# Patient Record
Sex: Female | Born: 1955 | Hispanic: No | Marital: Married | Smoking: Never smoker
Health system: Southern US, Community
[De-identification: ages and names within clinical notes are randomized; demographics above are authoritative.]

## PROBLEM LIST (undated history)

## (undated) DIAGNOSIS — I10 Essential (primary) hypertension: Secondary | ICD-10-CM

## (undated) DIAGNOSIS — E7800 Pure hypercholesterolemia, unspecified: Secondary | ICD-10-CM

---

## 2011-09-04 ENCOUNTER — Encounter (HOSPITAL_COMMUNITY): Payer: Self-pay

## 2011-09-04 ENCOUNTER — Emergency Department (INDEPENDENT_AMBULATORY_CARE_PROVIDER_SITE_OTHER)
Admission: EM | Admit: 2011-09-04 | Discharge: 2011-09-04 | Disposition: A | Discharge status: Home / Self Care | Payer: Medicaid Other | Source: Home / Self Care | Attending: Emergency Medicine | Admitting: Emergency Medicine

## 2011-09-04 DIAGNOSIS — Z760 Encounter for issue of repeat prescription: Secondary | ICD-10-CM

## 2011-09-04 DIAGNOSIS — E119 Type 2 diabetes mellitus without complications: Secondary | ICD-10-CM

## 2011-09-04 HISTORY — DX: Essential (primary) hypertension: I10

## 2011-09-04 HISTORY — DX: Pure hypercholesterolemia, unspecified: E78.00

## 2011-09-04 LAB — POCT URINALYSIS DIP (DEVICE)
Bilirubin Urine: NEGATIVE
Glucose, UA: NEGATIVE mg/dL
Hgb urine dipstick: NEGATIVE
Ketones, ur: NEGATIVE mg/dL
Specific Gravity, Urine: 1.02 (ref 1.005–1.030)
pH: 5.5 (ref 5.0–8.0)

## 2011-09-04 LAB — POCT I-STAT, CHEM 8
BUN: 11 mg/dL (ref 6–23)
Calcium, Ion: 1.23 mmol/L (ref 1.12–1.32)
Chloride: 101 mEq/L (ref 96–112)
Glucose, Bld: 155 mg/dL — ABNORMAL HIGH (ref 70–99)
Potassium: 3.6 mEq/L (ref 3.5–5.1)

## 2011-09-04 MED ORDER — CANDESARTAN CILEXETIL-HCTZ 16-12.5 MG PO TABS: 1.0000 | ORAL_TABLET | Freq: Every day | ORAL | Status: DC

## 2011-09-04 MED ORDER — METFORMIN HCL 850 MG PO TABS: 850.0000 mg | ORAL_TABLET | Freq: Two times a day (BID) | ORAL | Status: DC

## 2011-09-04 MED ORDER — GLIMEPIRIDE 2 MG PO TABS: 2.0000 mg | ORAL_TABLET | Freq: Every day | ORAL | Status: DC

## 2011-09-04 MED ORDER — FREESTYLE SYSTEM KIT: 1.0000 | PACK | Freq: Every day | Status: AC

## 2011-09-04 MED ORDER — GLUCOSE BLOOD VI STRP: ORAL_STRIP | Status: DC

## 2011-09-04 NOTE — ED Provider Notes (Signed)
History     CSN: [id number]  Arrival date & time 09/04/11  1350   First MD Initiated Contact with Patient 09/04/11 1425      Chief Complaint  Patient presents with  . Hypertension    (Consider location/radiation/quality/duration/timing/severity/associated sxs/prior treatment) HPI Comments: All history contained from patient and son, who interpreted for the patient. Patient is here for medication refill. Patient is here from [place], and is about to run out of her diabetic and blood pressure medications. Patient brought in empty boxes of metformin, glimepiride, and candesartan/HCTZ. Patient also brought in another empty packet of an oral hypoglycemic, but states that she's been out of this for over 2 months. Patient reports some fatigue, and urinary urgency, frequency, especially at night. She does not have a glucometer at home, and does not check her sugars. Patient denies any chest pain, coughing, wheezing, shortness of breath, nausea, vomiting, abdominal pain, fevers. They have an appointment with a primary care physician on May 1, but the son is unable to remember  the physician's name or practice.  ROS as noted in HPI. All other ROS negative.   The history is provided by the patient and a relative. A language interpreter was used.    Past Medical History  Diagnosis Date  . Hypertension   . Diabetes mellitus   . High cholesterol     History reviewed. No pertinent past surgical history.  History reviewed. No pertinent family history.  History  Substance Use Topics  . Smoking status: Never Smoker   . Smokeless tobacco: Not on file  . Alcohol Use: No    OB History    Grav Para Term Preterm Abortions TAB SAB Ect Mult Living                  Review of Systems  Allergies  Penicillins  Home Medications   Current Outpatient Rx  Name Route Sig Dispense Refill  . CANDESARTAN CILEXETIL-HCTZ 16-12.5 MG PO TABS Oral Take 1 tablet by mouth daily. 30 tablet 0  . GLIMEPIRIDE  2 MG PO TABS Oral Take 1 tablet (2 mg total) by mouth daily before breakfast. 30 tablet 0  . GLUCOSE BLOOD VI STRP  Check your sugar in the morning before you eat breakfast, and one hour after a meal. 100 each 12  . METFORMIN HCL 850 MG PO TABS Oral Take 1 tablet (850 mg total) by mouth 2 (two) times daily with a meal. 60 tablet 0    BP 137/90  Pulse 82  Temp(Src) 98.5 F (36.9 C) (Oral)  Resp 18  SpO2 98%  Physical Exam  Nursing note and vitals reviewed. Constitutional: She is oriented to person, place, and time. She appears well-developed and well-nourished.  HENT:  Head: Normocephalic and atraumatic.  Eyes: Conjunctivae and EOM are normal.  Neck: Normal range of motion.  Cardiovascular: Normal rate, regular rhythm and normal heart sounds.   Pulmonary/Chest: Effort normal and breath sounds normal.  Abdominal: Soft. Bowel sounds are normal. She exhibits no distension. There is no tenderness.  Musculoskeletal: Normal range of motion. She exhibits no edema and no tenderness.  Neurological: She is alert and oriented to person, place, and time.  Skin: Skin is warm and dry.  Psychiatric: She has a normal mood and affect. Her behavior is normal. Judgment and thought content normal.    ED Course  Procedures (including critical care time)  Labs Reviewed  GLUCOSE, CAPILLARY - Abnormal; Notable for the following:    Glucose-Capillary 151 (*)  All other components within normal limits  POCT I-STAT, CHEM 8 - Abnormal; Notable for the following:    Glucose, Bld 155 (*)    All other components within normal limits  POCT URINALYSIS DIP (DEVICE)   No results found.   1. Medication refill   2. Diabetes mellitus      Results for orders placed during the hospital encounter of 09/04/11  GLUCOSE, CAPILLARY      Component Value Range   Glucose-Capillary 151 (*) 70 - 99 (mg/dL)  POCT URINALYSIS DIP (DEVICE)      Component Value Range   Glucose, UA NEGATIVE  NEGATIVE (mg/dL)    Bilirubin Urine NEGATIVE  NEGATIVE    Ketones, ur NEGATIVE  NEGATIVE (mg/dL)   Specific Gravity, Urine 1.020  1.005 - 1.030    Hgb urine dipstick NEGATIVE  NEGATIVE    pH 5.5  5.0 - 8.0    Protein, ur NEGATIVE  NEGATIVE (mg/dL)   Urobilinogen, UA 0.2  0.0 - 1.0 (mg/dL)   Nitrite NEGATIVE  NEGATIVE    Leukocytes, UA NEGATIVE  NEGATIVE   POCT I-STAT, CHEM 8      Component Value Range   Sodium 139  135 - 145 (mEq/L)   Potassium 3.6  3.5 - 5.1 (mEq/L)   Chloride 101  96 - 112 (mEq/L)   BUN 11  6 - 23 (mg/dL)   Creatinine, Ser 1.61  0.50 - 1.10 (mg/dL)   Glucose, Bld 096 (*) 70 - 99 (mg/dL)   Calcium, Ion 0.45  4.09 - 1.32 (mmol/L)   TCO2 30  0 - 100 (mmol/L)   Hemoglobin 14.3  12.0 - 15.0 (g/dL)   HCT 81.1  91.4 - 78.2 (%)    MDM  Checking UA, as patient reports some urinary urgency and frequency. Patient has no abdominal pain, abdomen benign here. Also checking i-STAT, this patient has no previous labs or records available, prior to restarting patient on her medications. Patient is not spilling any glucose in urine, and i-STAT acceptable here today. Will not restart her on the other oral diabetic medication, as patient seems to be doing well on the current combination. Encouraged her to keep a log of her blood sugars so that her primary care physician can make the appropriate adjustments as needed. Discussed with son the importance of following up with the patient's primary care physician for ongoing management of her diabetes and hypertension. They agree with plan.  Luiz Blare, MD 09/04/11 [id number]

## 2011-09-04 NOTE — ED Notes (Addendum)
Traveling; out of her medications; feels tired, feels as if her glucose is low; NAD

## 2011-09-04 NOTE — Discharge Instructions (Signed)
Followup with your Dr. as scheduled. Be sure to check your sugar before breakfast, and then an hour after a meal. Keep a log of your blood sugars, this will help your primary care physician decide if you need to change your medication. Return if you have a fever above 100.4, if you get worse, or for any other concerns.

## 2011-09-06 MED ORDER — LOSARTAN POTASSIUM-HCTZ 50-12.5 MG PO TABS: 1.0000 | ORAL_TABLET | Freq: Every day | ORAL | Status: DC

## 2011-09-06 NOTE — ED Provider Notes (Signed)
E-prescribed a new rx (losartan-HCTZ 50/12.5 mg) to [facility] on [address].  Renaee Munda, MD 09/06/11 2134

## 2011-09-09 ENCOUNTER — Telehealth (HOSPITAL_COMMUNITY): Payer: Self-pay | Admitting: *Deleted

## 2011-09-09 NOTE — ED Notes (Signed)
I called pharmacist @ [facility] and told her we do not do prior auhorization's because we are not a primary care facility.  I told her Dr. Juanetta Gosling had e-prescribed the Hyzaar on Fri.  The pharmacist said it is only $57.99 and the [place] HCTZ  was $193.99. She said she will call the pt. and see if they can afford this.  She said the Lisinopril HCTZ is $22.29.  I asked Dr. Juanetta Gosling and he does not want to go with that medication. See if pt. can afford the Hyzaar.   Jacqueline Bush 09/09/2011

## 2011-10-04 ENCOUNTER — Telehealth (HOSPITAL_COMMUNITY): Payer: Self-pay | Admitting: *Deleted

## 2011-10-04 NOTE — ED Notes (Signed)
[facility] faxed prior authorization form for Losartan/HCTZ 50/12.5 mg. From Dr. Juanetta Gosling. He said to try and split them.  I called the pharmacist and she said Medicaid will cover the HCTZ but not the Losartan. She said they won't cover any ARB's.  They will cover Lisinopril.  Discussed with Dr. Juanetta Gosling and he said he did not see the pt. and does not like to change classes of drugs without seeing the pt. first. He said patient was supposed to f/u with PCP on 5/1. Call pt. and see if she did. If not Dr. Chaney Malling said it was OK to change it to Lisinopril 10 mg. QD #30 until pt. sees her PCP.

## 2011-10-04 NOTE — ED Notes (Signed)
I called and talked to patients daughter. She said she has not been seen by the PCP yet. She has to finish some paperwork before they will schedule her appt.  I told her the doctor was going to change the medicine to Lisinopril/HCTZ 10/12.5 mg. 1 QD #30 and to f/u with PCP ASAP.  I asked if she wants it at [facility] or [facility]. It is on the $ 4.00 list at [facility]. She said [facility] on [address]. I told her they don't have pharmacy. She said [facility] on [address]. Rx. called to pharmacist @ [phone]. Vassie Moselle 10/04/2011

## 2011-10-10 ENCOUNTER — Encounter: Payer: Self-pay | Admitting: Family Medicine

## 2011-10-10 ENCOUNTER — Ambulatory Visit (INDEPENDENT_AMBULATORY_CARE_PROVIDER_SITE_OTHER): Payer: Medicaid Other | Admitting: Family Medicine

## 2011-10-10 VITALS — BP 120/81 | HR 72 | Ht 63.0 in | Wt 224.0 lb

## 2011-10-10 DIAGNOSIS — I10 Essential (primary) hypertension: Secondary | ICD-10-CM

## 2011-10-10 DIAGNOSIS — R413 Other amnesia: Secondary | ICD-10-CM

## 2011-10-10 DIAGNOSIS — R7309 Other abnormal glucose: Secondary | ICD-10-CM

## 2011-10-10 DIAGNOSIS — E119 Type 2 diabetes mellitus without complications: Secondary | ICD-10-CM

## 2011-10-10 DIAGNOSIS — R739 Hyperglycemia, unspecified: Secondary | ICD-10-CM

## 2011-10-10 MED ORDER — METFORMIN HCL 1000 MG PO TABS: 1000.0000 mg | ORAL_TABLET | Freq: Two times a day (BID) | ORAL | Status: DC

## 2011-10-10 MED ORDER — LOSARTAN POTASSIUM 50 MG PO TABS: 50.0000 mg | ORAL_TABLET | Freq: Every day | ORAL | Status: DC

## 2011-10-10 MED ORDER — WRIST BLOOD PRESSURE MONITOR MISC: 1.0000 | Freq: Once | Status: AC

## 2011-10-10 MED ORDER — GLUCOSE BLOOD VI STRP: ORAL_STRIP | Status: AC

## 2011-10-10 MED ORDER — ACCU-CHEK SOFT TOUCH LANCETS MISC: Status: AC

## 2011-10-10 MED ORDER — HYDROCHLOROTHIAZIDE 12.5 MG PO CAPS: 12.5000 mg | ORAL_CAPSULE | Freq: Every day | ORAL | Status: DC

## 2011-10-10 MED ORDER — GLIMEPIRIDE 2 MG PO TABS: 4.0000 mg | ORAL_TABLET | Freq: Every day | ORAL | Status: DC

## 2011-10-10 NOTE — Progress Notes (Signed)
  Subjective:    Patient ID: Jacqueline Bush, female    DOB: September 24, 1955, 56 y.o.   MRN: [id number]  HPI 1) Diabetes: Out of medications, has been seen in urgent care but no pcp- has gone back and forth to [place] for medical care.   Has been out of diabetes medications- usually takes amaryl 4mg  daily, januvia type medication, and metformin bid.  Not checking bg currently b/c out of lancets and strips.  Would like rx refill for these.  Also would like all refills for 3 month supply b/c may be traveling back to [place] and wants to have ample supply.  No symptoms of hypoglycemia.  No shakiness.  No confusion.  No irritability.    2) HTN: On bp combination pill that has ARB and HCTZ medications in it.  Got this from [place].  Would like to have refill on this medication.  Aware that medicaid may not pay for combination pills so is ok with taking 2 tablets in place of this one mediation.   Pt has been taking bp medication daily.  Not yet out of these tablets.  BP today 120/81.  No dizziness.  No vision changes. No syncope.    3) Memory problems: Seems to be more forgetful recently, will forget things that she is told.  No specific examples that they can remember today.  Pt and family are concerned about her worsening memory.  Would like to have this fully evaluated.  Functioning well, doing ADLs without problem.    HPI obtained with the help of arabic interpreter that pt brought with her to her appointment today.   Review of Systems As per above    Objective:   Physical Exam  Constitutional: She appears well-developed and well-nourished. No distress.  HENT:  Head: Normocephalic and atraumatic.  Mouth/Throat: No oropharyngeal exudate.  Eyes: Conjunctivae are normal. Right eye exhibits no discharge. Left eye exhibits no discharge.  Neck: Normal range of motion.  Cardiovascular: Normal rate, regular rhythm and normal heart sounds.   No murmur heard. Pulmonary/Chest: Effort normal and breath sounds  normal. No respiratory distress. She has no wheezes.  Abdominal: Soft. She exhibits no distension.  Neurological: She is alert.  Psychiatric: She has a normal mood and affect.          Assessment & Plan:

## 2011-10-10 NOTE — Patient Instructions (Signed)
Return in 2-4 weeks for recheck and to discuss memory problem

## 2011-10-13 ENCOUNTER — Encounter: Payer: Self-pay | Admitting: Family Medicine

## 2011-10-13 DIAGNOSIS — I10 Essential (primary) hypertension: Secondary | ICD-10-CM | POA: Insufficient documentation

## 2011-10-13 DIAGNOSIS — E785 Hyperlipidemia, unspecified: Secondary | ICD-10-CM | POA: Insufficient documentation

## 2011-10-13 DIAGNOSIS — R413 Other amnesia: Secondary | ICD-10-CM | POA: Insufficient documentation

## 2011-10-13 DIAGNOSIS — E119 Type 2 diabetes mellitus without complications: Secondary | ICD-10-CM | POA: Insufficient documentation

## 2011-10-13 NOTE — Assessment & Plan Note (Addendum)
Pt to return in 2-4 weeks for f/up on this problem.  Pt and family to keep detailed logbook of examples of memory loss and how frequent this is occuring

## 2011-10-13 NOTE — Assessment & Plan Note (Addendum)
Will give rx for losartan and hctz which is very similar to the combination pill she was on in [place].  Baseline creatinine obtained in April at urgent care- creatinine 0.8. Pt to return in 2-4 weeks for fup.

## 2011-10-13 NOTE — Assessment & Plan Note (Addendum)
Will give refills on medications that she was previously prescribed by MD in [place]- amaryl 4 mg and metformin 1000mg  po bid.  Will hold on januvia at this time and see how pt does with taking these 2 medications consistently.  If needed can restart at 3 month f/up appointment.  Also at future appt need to do foot check, see if dental and eye exams uptodate, do more nutrition teaching, and ensure that pt is checking blood glucose levels at home.

## 2011-10-24 ENCOUNTER — Encounter: Payer: Self-pay | Admitting: Family Medicine

## 2011-10-24 ENCOUNTER — Ambulatory Visit (INDEPENDENT_AMBULATORY_CARE_PROVIDER_SITE_OTHER): Payer: Medicaid Other | Admitting: Family Medicine

## 2011-10-24 VITALS — BP 113/76 | HR 76 | Ht 63.0 in | Wt 222.0 lb

## 2011-10-24 DIAGNOSIS — E119 Type 2 diabetes mellitus without complications: Secondary | ICD-10-CM

## 2011-10-24 DIAGNOSIS — R413 Other amnesia: Secondary | ICD-10-CM

## 2011-10-24 DIAGNOSIS — I10 Essential (primary) hypertension: Secondary | ICD-10-CM

## 2011-10-24 LAB — COMPREHENSIVE METABOLIC PANEL
ALT: 16 U/L (ref 0–35)
Albumin: 4.4 g/dL (ref 3.5–5.2)
CO2: 28 mEq/L (ref 19–32)
Calcium: 9.9 mg/dL (ref 8.4–10.5)
Chloride: 99 mEq/L (ref 96–112)
Glucose, Bld: 167 mg/dL — ABNORMAL HIGH (ref 70–99)
Sodium: 138 mEq/L (ref 135–145)
Total Bilirubin: 0.5 mg/dL (ref 0.3–1.2)
Total Protein: 7.4 g/dL (ref 6.0–8.3)

## 2011-10-24 LAB — CBC WITH DIFFERENTIAL/PLATELET
Eosinophils Absolute: 0.1 10*3/uL (ref 0.0–0.7)
Hemoglobin: 12.2 g/dL (ref 12.0–15.0)
Lymphocytes Relative: 34 % (ref 12–46)
Lymphs Abs: 2 10*3/uL (ref 0.7–4.0)
MCH: 26.8 pg (ref 26.0–34.0)
MCV: 79.8 fL (ref 78.0–100.0)
Monocytes Relative: 7 % (ref 3–12)
Neutrophils Relative %: 56 % (ref 43–77)
Platelets: 209 10*3/uL (ref 150–400)
RBC: 4.56 MIL/uL (ref 3.87–5.11)
WBC: 5.9 10*3/uL (ref 4.0–10.5)

## 2011-10-27 MED ORDER — LISINOPRIL-HYDROCHLOROTHIAZIDE 10-12.5 MG PO TABS: 1.0000 | ORAL_TABLET | Freq: Every day | ORAL | Status: DC

## 2011-10-27 MED ORDER — SERTRALINE HCL 50 MG PO TABS: 50.0000 mg | ORAL_TABLET | Freq: Every day | ORAL | Status: DC

## 2011-10-27 NOTE — Assessment & Plan Note (Signed)
Pt to continue to monitor bG levels and bring in bg log to next apppointment.  Will discuss with patient at next appt about possibly restarting [place] if she would like- since she continues to have elevations and seemed to doing well on januvia type medication back in [place].  Will discuss in more detail at future appointment.

## 2011-10-27 NOTE — Assessment & Plan Note (Signed)
bp well controlled on current regimen.  Will change rx to combo medication as pt requested.

## 2011-10-27 NOTE — Progress Notes (Addendum)
Subjective:    Patient ID: Jacqueline Bush, female    DOB: Jun 11, 1955, 56 y.o.   MRN: [id number]  HPI Memory problems: Pt states that she is very forgetful- forgets that stove is on and that something is on the stove.  Called interpreter 4 x day prior to appointment to make sure that she was going to come and pick her up and interpret for her.  She asks friends the same question multiple x in the same day. Can't remember to take her medication.  Forgets where she puts things.  This started approx 3 years ago- but seemed to get worse after her son had a serious car accident that left him with a hip replacement, abnormal gait, and apparently he has a severe untreated depression.   He lives with him and everytime that she looks at him she just crys.  Even during the appointment when she talks about him- she becomes tearful.  PHQ-9 performed- on a daily basis- pt has feelings of depression,  Feels hopeless, has little interest in doing things (used to enjoy baking),  Has sleep problems,  Has very little appetite, has problems with concentration, moves slower than others around her.  Denies any SI or HI.  PHQ-9 socre of 24. No fever. No drowsiness. No seizures.   Diabetes: States that she has been taking all blood sugar medications.  Fasting bg has been running in 150's.  And pp BG 170-180's.  No shakiness. No dizziness. No syncope.  No fever.  No symptoms of hypoglycemia.  No abd pain. Tolerating all medications well.   HTN: Taking bp medications as directed.  Pt states that she would like to go back on to combination pill of - prinizidez/zesthetic.  Did ok with these meds separate but checked with pharmacy and medicaid will pay for the combination pill as well and she prefers the 1 combo pill in comparison to the 2 tabs. No h/a. No vision changes. No syncope.  Not checking bp at hom.   Smoking status reviewed.   Review of Systems As per above    Objective:   Physical Exam  Constitutional: She is  oriented to person, place, and time. She appears well-developed and well-nourished.  HENT:  Head: Normocephalic and atraumatic.  Eyes: Conjunctivae and EOM are normal. Pupils are equal, round, and reactive to light. Right eye exhibits no discharge. Left eye exhibits no discharge.  Neck: Normal range of motion. Neck supple. No thyromegaly present.  Cardiovascular: Normal rate, regular rhythm and normal heart sounds.   No murmur heard. Pulmonary/Chest: Effort normal and breath sounds normal. No respiratory distress. She has no wheezes. She has no rales. She exhibits no tenderness.  Musculoskeletal: She exhibits no edema.  Lymphadenopathy:    She has no cervical adenopathy.  Neurological: She is alert and oriented to person, place, and time. She displays normal reflexes. No cranial nerve deficit. She exhibits normal muscle tone. Coordination normal.       CN II-XII grossly intact   Clock drawing test- within normal limits.   Skin: No rash noted.  Psychiatric:       Flat affect.  PHQ-9 score as per HPI.    Had some difficulty repeating back the 3 items- repeated back 2 of the 3 items correction.   Only got 1 item correct for 3 item recall.    Able to identify 3 items and name correctly.              Assessment & Plan:

## 2011-10-27 NOTE — Assessment & Plan Note (Addendum)
Pt agrees to cbc, bmet,  tsh lab draw today.  Wanted to hold on CT scan at this time and try the medication that I was recommending.  I recommend that pt start on zoloft 25mg  x 1 week, then increase to 50mg  daily.  We must first treat pt's depression and see if this helps resolve symtpoms.  I think with significant impairment that CT scan is still reasonable but pt refuses at this time- will discuss at future appointment.  Pt to return in 2 weeks for recheck.   Also, told pt that if her son doesn't have pcp that I could talk to administration of our clinic to see if we could  Get him in at our clinic for evaluation and treatment for his depressed mood.   Greater than 50% of face time spent in counseling patient.

## 2013-01-13 ENCOUNTER — Telehealth: Payer: Self-pay | Admitting: *Deleted

## 2013-01-13 NOTE — Telephone Encounter (Signed)
Letter sent regarding diabetes follow up care Elizabeth Daniah Zaldivar, RN-BSN   

## 2013-02-15 ENCOUNTER — Emergency Department (HOSPITAL_COMMUNITY): Payer: Medicaid Other

## 2013-02-15 ENCOUNTER — Emergency Department (HOSPITAL_COMMUNITY)
Admission: EM | Admit: 2013-02-15 | Discharge: 2013-02-16 | Disposition: A | Discharge status: Home / Self Care | Payer: Medicaid Other | Attending: Emergency Medicine | Admitting: Emergency Medicine

## 2013-02-15 ENCOUNTER — Encounter (HOSPITAL_COMMUNITY): Payer: Self-pay | Admitting: *Deleted

## 2013-02-15 DIAGNOSIS — R0602 Shortness of breath: Secondary | ICD-10-CM | POA: Insufficient documentation

## 2013-02-15 DIAGNOSIS — I10 Essential (primary) hypertension: Secondary | ICD-10-CM | POA: Insufficient documentation

## 2013-02-15 DIAGNOSIS — E119 Type 2 diabetes mellitus without complications: Secondary | ICD-10-CM | POA: Insufficient documentation

## 2013-02-15 DIAGNOSIS — Z880 Allergy status to penicillin: Secondary | ICD-10-CM | POA: Insufficient documentation

## 2013-02-15 DIAGNOSIS — Z79899 Other long term (current) drug therapy: Secondary | ICD-10-CM | POA: Insufficient documentation

## 2013-02-15 DIAGNOSIS — T5491XA Toxic effect of unspecified corrosive substance, accidental (unintentional), initial encounter: Secondary | ICD-10-CM | POA: Insufficient documentation

## 2013-02-15 DIAGNOSIS — R1310 Dysphagia, unspecified: Secondary | ICD-10-CM | POA: Insufficient documentation

## 2013-02-15 DIAGNOSIS — T5991XA Toxic effect of unspecified gases, fumes and vapors, accidental (unintentional), initial encounter: Secondary | ICD-10-CM

## 2013-02-15 DIAGNOSIS — Y9289 Other specified places as the place of occurrence of the external cause: Secondary | ICD-10-CM | POA: Insufficient documentation

## 2013-02-15 DIAGNOSIS — Z862 Personal history of diseases of the blood and blood-forming organs and certain disorders involving the immune mechanism: Secondary | ICD-10-CM | POA: Insufficient documentation

## 2013-02-15 DIAGNOSIS — Z8639 Personal history of other endocrine, nutritional and metabolic disease: Secondary | ICD-10-CM | POA: Insufficient documentation

## 2013-02-15 DIAGNOSIS — Y9389 Activity, other specified: Secondary | ICD-10-CM | POA: Insufficient documentation

## 2013-02-15 MED ORDER — ALBUTEROL SULFATE HFA 108 (90 BASE) MCG/ACT IN AERS
2.0000 | INHALATION_SPRAY | RESPIRATORY_TRACT | Status: DC | PRN
  Administered 2013-02-16: 2 via RESPIRATORY_TRACT
  Filled 2013-02-15: qty 6.7

## 2013-02-15 MED ORDER — ALBUTEROL SULFATE (5 MG/ML) 0.5% IN NEBU
2.5000 mg | INHALATION_SOLUTION | Freq: Once | RESPIRATORY_TRACT | Status: AC
  Administered 2013-02-15: 2.5 mg via RESPIRATORY_TRACT
  Filled 2013-02-15: qty 0.5

## 2013-02-15 NOTE — ED Provider Notes (Signed)
CSN: [id number]     Arrival date & time 02/15/13  2104 History   First MD Initiated Contact with Patient 02/15/13 2118     Chief Complaint  Patient presents with  . Toxic Inhalation   (Consider location/radiation/quality/duration/timing/severity/associated sxs/prior Treatment) HPI Comments: Patient presents to the ER for evaluation after inhalation of chemicals. Patient was clean her bathroom with Clorox bleach and a toilet cleaner when she became aware of fumes that were causing her to feel short of breath. She feels like she is having trouble swallowing her throat is painful and swollen. There is no associated chest pain.   Past Medical History  Diagnosis Date  . Hypertension   . Diabetes mellitus   . High cholesterol    History reviewed. No pertinent past surgical history. Family History  Problem Relation Age of Onset  . Diabetes Mother   . Hypertension Mother   . Hypertension Father   . Heart disease Father   . Hyperlipidemia Father    History  Substance Use Topics  . Smoking status: Never Smoker   . Smokeless tobacco: Not on file  . Alcohol Use: No   OB History   Grav Para Term Preterm Abortions TAB SAB Ect Mult Living                 Review of Systems  HENT: Positive for trouble swallowing.   Respiratory: Positive for shortness of breath.   All other systems reviewed and are negative.    Allergies  Penicillins  Home Medications   Current Outpatient Rx  Name  Route  Sig  Dispense  Refill  . metFORMIN (GLUCOPHAGE) 850 MG tablet   Oral   Take 850 mg by mouth 2 (two) times daily with a meal.         . Blood Pressure Monitoring (WRIST BLOOD PRESSURE MONITOR) MISC   Does not apply   1 Device by Does not apply route once.   1 each   0   . glimepiride (AMARYL) 2 MG tablet   Oral   Take 2 tablets (4 mg total) by mouth daily before breakfast.   90 tablet   4   . lisinopril-hydrochlorothiazide (ZESTORETIC) 10-12.5 MG per tablet   Oral   Take 1 tablet  by mouth daily.   90 tablet   3   . EXPIRED: sertraline (ZOLOFT) 50 MG tablet   Oral   Take 1 tablet (50 mg total) by mouth daily. Take 1/2 tablet daily x 1 week, then 1 tablet daily.   30 tablet   3    BP 116/58  Pulse 88  SpO2 100% Physical Exam  Constitutional: She is oriented to person, place, and time. She appears well-developed and well-nourished. No distress.  HENT:  Head: Normocephalic and atraumatic.  Right Ear: Hearing normal.  Left Ear: Hearing normal.  Nose: Nose normal.  Mouth/Throat: Oropharynx is clear and moist and mucous membranes are normal.  Eyes: Conjunctivae and EOM are normal. Pupils are equal, round, and reactive to light.  Neck: Normal range of motion. Neck supple.  Cardiovascular: Regular rhythm, S1 normal and S2 normal.  Exam reveals no gallop and no friction rub.   No murmur heard. Pulmonary/Chest: Effort normal and breath sounds normal. No respiratory distress. She exhibits no tenderness.  Abdominal: Soft. Normal appearance and bowel sounds are normal. There is no hepatosplenomegaly. There is no tenderness. There is no rebound, no guarding, no tenderness at McBurney's point and negative Murphy's sign. No hernia.  Musculoskeletal:  Normal range of motion.  Neurological: She is alert and oriented to person, place, and time. She has normal strength. No cranial nerve deficit or sensory deficit. Coordination normal. GCS eye subscore is 4. GCS verbal subscore is 5. GCS motor subscore is 6.  Skin: Skin is warm, dry and intact. No rash noted. No cyanosis.  Psychiatric: Her speech is normal and behavior is normal. Thought content normal. Her mood appears anxious.    ED Course  Procedures (including critical care time) Labs Review Labs Reviewed - No data to display Imaging Review Dg Chest 2 View  02/15/2013   CLINICAL DATA:  Shortness of breast.  EXAM: CHEST - 2 VIEW  COMPARISON:  None  FINDINGS: Lung volumes are low. The heart size is normal. No edema,  infiltrate, nodule, pneumothorax or pleural fluid is identified. The bony thorax is unremarkable.  IMPRESSION: Low lung volumes. No active disease.   Electronically Signed   By: Irish Lack   On: 02/15/2013 21:57    MDM  Diagnosis: Chemical inhalation  Patient presents to the ER for evaluation after inhalation of cleaning chemicals. Patient was using Clorox bleach as well as "[employer]" total care which apparently is also bleach. Control was contacted. They agreed with symptomatic treatment, monitoring and discharged. The patient's x-ray was unremarkable. She is feeling better after nebulizer, likely secondary to the soothing effects of the saline as much as the dilator effect. Patient will be discharged with albuterol inhaler to be used as needed. Return if her symptoms worsen.    Gilda Crease, MD 02/15/13 [phone]

## 2013-02-15 NOTE — ED Notes (Signed)
Per son pt cleaning with several cleaning products; clorox included; pt stated she saw bubbling then started having throat pain and trouble breathing

## 2013-02-15 NOTE — ED Notes (Addendum)
Poison Control notified: Jacqueline Bush Recommendations - Give O2, Breathing treatment and monitor for 3 hours after breathing treatment to watch for rebound effect

## 2013-02-16 NOTE — ED Notes (Signed)
Patient is alert and oriented x3.  She was given DC instructions and follow up visit instructions.  Patient gave verbal understanding. She was DC ambulatory under her own power to home.  V/S stable.  He was not showing any signs of distress on DC 

## 2017-06-24 ENCOUNTER — Ambulatory Visit (HOSPITAL_COMMUNITY)
Admission: EM | Admit: 2017-06-24 | Discharge: 2017-06-24 | Disposition: A | Discharge status: Home / Self Care | Payer: Medicaid Other | Attending: Family Medicine | Admitting: Family Medicine

## 2017-06-24 ENCOUNTER — Other Ambulatory Visit: Payer: Self-pay

## 2017-06-24 ENCOUNTER — Encounter (HOSPITAL_COMMUNITY): Payer: Self-pay | Admitting: Emergency Medicine

## 2017-06-24 DIAGNOSIS — I10 Essential (primary) hypertension: Secondary | ICD-10-CM | POA: Diagnosis not present

## 2017-06-24 DIAGNOSIS — E119 Type 2 diabetes mellitus without complications: Secondary | ICD-10-CM

## 2017-06-24 DIAGNOSIS — Z760 Encounter for issue of repeat prescription: Secondary | ICD-10-CM | POA: Diagnosis not present

## 2017-06-24 MED ORDER — SITAGLIPTIN PHOS-METFORMIN HCL 50-1000 MG PO TABS: 1.0000 | ORAL_TABLET | Freq: Two times a day (BID) | ORAL | 0 refills | Status: DC

## 2017-06-24 MED ORDER — PRAVASTATIN SODIUM 40 MG PO TABS: 40.0000 mg | ORAL_TABLET | Freq: Every day | ORAL | 0 refills | Status: DC

## 2017-06-24 MED ORDER — LISINOPRIL-HYDROCHLOROTHIAZIDE 10-12.5 MG PO TABS: 1.0000 | ORAL_TABLET | Freq: Every day | ORAL | 0 refills | Status: DC

## 2017-06-24 MED ORDER — ASPIRIN 81 MG PO CHEW: 81.0000 mg | CHEWABLE_TABLET | Freq: Every day | ORAL | 0 refills | Status: AC

## 2017-06-24 NOTE — ED Triage Notes (Signed)
Pt requesting refills on her medicines.

## 2017-06-24 NOTE — ED Provider Notes (Signed)
[facility]    CSN: [id number] Arrival date & time: 06/24/17  1223     History   Chief Complaint Chief Complaint  Patient presents with  . Medication Refill    HPI Jacqueline Bush is a 62 y.o. female.   62 year old female, with history of diabetes, hypertension, presenting today for medication refill.  Sent here with the patient provides a history due to language barrier.  He states that she has been out of her medications for about a month.  [name].   The history is provided by a relative. The history is limited by a language barrier.  Medication Refill  Medications/supplies requested:  Randel Books, pravachol, lisinopril-hctz Reason for request:  Clinic/provider not available Medications taken before: yes - see home medications   Patient has complete original prescription information: no   Source of information:  Clinic/provider   Past Medical History:  Diagnosis Date  . Diabetes mellitus   . High cholesterol   . Hypertension     Patient Active Problem List   Diagnosis Date Noted  . Hypertension 10/13/2011  . Diabetes mellitus 10/13/2011  . Memory difficulty 10/13/2011  . Hyperlipidemia 10/13/2011    History reviewed. No pertinent surgical history.  OB History    No data available       Home Medications    Prior to Admission medications   Medication Sig Start Date End Date Taking? Authorizing Provider  aspirin 81 MG chewable tablet Chew 1 tablet (81 mg total) by mouth daily. 06/24/17 07/24/17  Blue, Olivia C, PA-C  Blood Pressure Monitoring (WRIST BLOOD PRESSURE MONITOR) MISC 1 Device by Does not apply route once. 10/10/11   Kristen Cardinal, MD  glimepiride (AMARYL) 2 MG tablet Take 2 tablets (4 mg total) by mouth daily before breakfast. 10/10/11   Caviness, Miachel Roux, MD  lisinopril-hydrochlorothiazide (ZESTORETIC) 10-12.5 MG tablet Take 1 tablet by mouth daily. 06/24/17 07/24/17  Blue, Olivia C, PA-C  metFORMIN  (GLUCOPHAGE) 850 MG tablet Take 850 mg by mouth 2 (two) times daily with a meal.    [provider]  pravastatin (PRAVACHOL) 40 MG tablet Take 1 tablet (40 mg total) by mouth daily. 06/24/17   Blue, Olivia C, PA-C  sertraline (ZOLOFT) 50 MG tablet Take 1 tablet (50 mg total) by mouth daily. Take 1/2 tablet daily x 1 week, then 1 tablet daily. 10/27/11 10/26/12  Kristen Cardinal, MD  sitaGLIPtin-metformin (JANUMET) 50-1000 MG tablet Take 1 tablet by mouth 2 (two) times daily with a meal. 06/24/17 07/24/17  Blue, Marylene Land, PA-C    Family History Family History  Problem Relation Age of Onset  . Diabetes Mother   . Hypertension Mother   . Hypertension Father   . Heart disease Father   . Hyperlipidemia Father     Social History Social History   Tobacco Use  . Smoking status: Never Smoker  Substance Use Topics  . Alcohol use: No  . Drug use: No     Allergies   Penicillins   Review of Systems Review of Systems  Constitutional: Negative for chills and fever.  HENT: Negative for ear pain and sore throat.   Eyes: Negative for pain and visual disturbance.  Respiratory: Negative for cough and shortness of breath.   Cardiovascular: Negative for chest pain and palpitations.  Gastrointestinal: Negative for abdominal pain and vomiting.  Genitourinary: Negative for dysuria and hematuria.  Musculoskeletal: Negative for arthralgias and back pain.  Skin: Negative for color  change and rash.  Neurological: Negative for seizures and syncope.  All other systems reviewed and are negative.    Physical Exam Triage Vital Signs ED Triage Vitals [06/24/17 1244]  Enc Vitals Group     BP 131/79     Pulse Rate 82     Resp 16     Temp 98.1 F (36.7 C)     Temp src      SpO2 97 %     Weight      Height      Head Circumference      Peak Flow      Pain Score      Pain Loc      Pain Edu?      Excl. in GC?    No data found.  Updated Vital Signs BP 131/79   Pulse 82   Temp 98.1 F  (36.7 C)   Resp 16   SpO2 97%   Visual Acuity Right Eye Distance:   Left Eye Distance:   Bilateral Distance:    Right Eye Near:   Left Eye Near:    Bilateral Near:     Physical Exam  Constitutional: She appears well-developed and well-nourished. No distress.  HENT:  Head: Normocephalic and atraumatic.  Eyes: Conjunctivae are normal.  Neck: Neck supple.  Cardiovascular: Normal rate and regular rhythm.  No murmur heard. Pulmonary/Chest: Effort normal and breath sounds normal. No respiratory distress.  Abdominal: Soft. There is no tenderness.  Musculoskeletal: She exhibits no edema.  Neurological: She is alert.  Skin: Skin is warm and dry.  Psychiatric: She has a normal mood and affect.  Nursing note and vitals reviewed.    UC Treatments / Results  Labs (all labs ordered are listed, but only abnormal results are displayed) Labs Reviewed - No data to display  EKG  EKG Interpretation None       Radiology No results found.  Procedures Procedures (including critical care time)  Medications Ordered in UC Medications - No data to display   Initial Impression / Assessment and Plan / UC Course  I have reviewed the triage vital signs and the nursing notes.  Pertinent labs & imaging results that were available during my care of the patient were reviewed by me and considered in my medical decision making (see chart for details).     Patient here today for medication refill.  She has no complaints at this time.  Final Clinical Impressions(s) / UC Diagnoses   Final diagnoses:  Medication refill    ED Discharge Orders        Ordered    sitaGLIPtin-metformin (JANUMET) 50-1000 MG tablet  2 times daily with meals     06/24/17 1306    pravastatin (PRAVACHOL) 40 MG tablet  Daily     06/24/17 1306    lisinopril-hydrochlorothiazide (ZESTORETIC) 10-12.5 MG tablet  Daily     06/24/17 1306    aspirin 81 MG chewable tablet  Daily     06/24/17 1306        Controlled Substance Prescriptions [place] Controlled Substance Registry consulted? Not Applicable   Alecia LemmingBlue, Olivia C, [place]-C 06/24/17 1345

## 2017-07-22 ENCOUNTER — Ambulatory Visit (INDEPENDENT_AMBULATORY_CARE_PROVIDER_SITE_OTHER): Payer: Medicaid Other | Admitting: Family Medicine

## 2017-07-22 ENCOUNTER — Encounter: Payer: Self-pay | Admitting: Family Medicine

## 2017-07-22 ENCOUNTER — Other Ambulatory Visit: Payer: Self-pay

## 2017-07-22 VITALS — BP 120/62 | HR 72 | Temp 98.3°F | Ht 63.0 in | Wt 206.0 lb

## 2017-07-22 DIAGNOSIS — Z0000 Encounter for general adult medical examination without abnormal findings: Secondary | ICD-10-CM

## 2017-07-22 DIAGNOSIS — R011 Cardiac murmur, unspecified: Secondary | ICD-10-CM

## 2017-07-22 DIAGNOSIS — E119 Type 2 diabetes mellitus without complications: Secondary | ICD-10-CM

## 2017-07-22 LAB — GLUCOSE, POCT (MANUAL RESULT ENTRY): POC GLUCOSE: 241 mg/dL — AB (ref 70–99)

## 2017-07-22 MED ORDER — SITAGLIPTIN PHOS-METFORMIN HCL 50-1000 MG PO TABS: 1.0000 | ORAL_TABLET | Freq: Two times a day (BID) | ORAL | 0 refills | Status: DC

## 2017-07-22 NOTE — Patient Instructions (Signed)
It was a pleasure to see you today! Thank you for choosing [facility] for your primary care. Jacqueline Bush was seen for establishing care. Come back to the clinic if you have any new concerns, and go to the emergency room if you have any life threatening problems.  Today we refilled some meds and discussed some long term preventative care options.    If we did any lab work today, and the results require attention, either me or my nurse will get in touch with you. If everything is normal, you will get a letter in mail and a message via . If you don't hear from us in two weeks, please give us a call. Otherwise, we look forward to seeing you again at your next visit. If you have any questions or concerns before then, please call the clinic at [phone].  Please bring all your medications to every doctors visit  Sign up for My Chart to have easy access to your labs results, and communication with your Primary care physician.    Please check-out at the front desk before leaving the clinic.    Best,  Dr. Marthenia RollingScott Gleason Ardoin FAMILY MEDICINE RESIDENT - PGY1 07/22/2017 12:08 PM

## 2017-07-23 DIAGNOSIS — Z0000 Encounter for general adult medical examination without abnormal findings: Secondary | ICD-10-CM | POA: Insufficient documentation

## 2017-07-23 DIAGNOSIS — R011 Cardiac murmur, unspecified: Secondary | ICD-10-CM | POA: Insufficient documentation

## 2017-07-23 NOTE — Assessment & Plan Note (Signed)
Was on janumet 50/1000 BID but ran out of prior prescription.  We discussed concern for long term health risks of not maintaining medication  Will refill

## 2017-07-23 NOTE — Assessment & Plan Note (Signed)
3/6 systolic.  No known hx and no symptoms.  Patient agrees to echo.

## 2017-07-23 NOTE — Assessment & Plan Note (Signed)
Patient firmly declines pap/colonoscopy.  We discussed health risks of not complying with screening.

## 2017-07-23 NOTE — Progress Notes (Signed)
    Subjective:  Jacqueline Bush is a 62 y.o. female who [facility] with a chief complaint of establishing care and wanting refill of DM2 med (janumet).   HPI: Patient is establishing care after returning to town.  She has HTN and DM2 without insulin use.   She denies any current problematic symptoms and has no physical complaints.   She only wants to have a doctor and get her meds filled.  She denies neuropathy, chest pain, SOB, edema, headahces, dizziness, urinary/bowel symptoms, rashed etc  Review of Systems  Constitutional: Negative for chills, fever, malaise/fatigue and weight loss.  HENT: Negative.   Eyes: Negative.   Respiratory: Negative for hemoptysis, shortness of breath and wheezing.   Cardiovascular: Negative for chest pain, palpitations, orthopnea and leg swelling.  Gastrointestinal: Negative.   Genitourinary: Negative.   Musculoskeletal: Negative for falls, myalgias and neck pain.  Skin: Negative.   Neurological: Negative for dizziness, tingling and loss of consciousness.  Psychiatric/Behavioral: Negative for depression, hallucinations, substance abuse and suicidal ideas.     Objective:  Physical Exam: BP 120/62   Pulse 72   Temp 98.3 F (36.8 C) (Oral)   Ht 5\' 3"  (1.6 m)   Wt 206 lb (93.4 kg)   SpO2 97%   BMI 36.49 kg/m   Gen: NAD, sitting comfortably  CV: RRR with no murmurs appreciated Pulm: NWOB, CTAB with no crackles, wheezes, or rhonchi GI:  Soft, Nontender, Nondistended. MSK: no edema, cyanosis, or clubbing noted Skin: warm, dry Neuro: grossly normal, moves all extremities Psych: Normal affect and thought content  Results for orders placed or performed in visit on 07/22/17 (from the past 72 hour(s))  POCT glucose (manual entry)     Status: Abnormal   Collection Time: 07/22/17 11:44 AM  Result Value Ref Range   POC Glucose 241 (A) 70 - 99 mg/dl     Assessment/Plan:  DM (diabetes mellitus), type 2 (HCC) Was on janumet 50/1000 BID  but ran out of prior prescription.  We discussed concern for long term health risks of not maintaining medication  Will refill  Systolic murmur 3/6 systolic.  No known hx and no symptoms.  Patient agrees to echo.  Preventative health care Patient firmly declines pap/colonoscopy.  We discussed health risks of not complying with screening.   Marthenia RollingScott Niya Behler, DO FAMILY MEDICINE RESIDENT - PGY1 07/23/2017 1:53 PM

## 2017-08-05 ENCOUNTER — Telehealth: Payer: Self-pay | Admitting: Family Medicine

## 2017-08-05 MED ORDER — METFORMIN HCL 500 MG PO TABS: 500.0000 mg | ORAL_TABLET | Freq: Two times a day (BID) | ORAL | 3 refills | Status: DC

## 2017-08-05 MED ORDER — METFORMIN HCL 1000 MG PO TABS
1000.0000 mg | ORAL_TABLET | Freq: Two times a day (BID) | ORAL | 3 refills | Status: DC
Start: 2017-08-05 — End: 2017-09-30

## 2017-08-05 MED ORDER — SITAGLIPTIN PHOSPHATE 50 MG PO TABS: 50.0000 mg | ORAL_TABLET | Freq: Two times a day (BID) | ORAL | 2 refills | Status: DC

## 2017-08-05 NOTE — Telephone Encounter (Signed)
Pt [facility] a [facility] on [facility] [address]. She said she's been trying to get this medication since her appointment on 2-19. She has been out of this medication for her diabetes since then. Please advise   Patients daughter in law can be reached at: Jerrel IvoryGabrielle [phone]

## 2017-08-05 NOTE — Telephone Encounter (Signed)
LVM on daughter in law's phone to call office back to inform her of below. Jacqueline Bush, Izack Hoogland D, [place]MA

## 2017-09-24 ENCOUNTER — Ambulatory Visit: Payer: Medicaid Other | Admitting: Family Medicine

## 2017-09-30 ENCOUNTER — Encounter: Payer: Self-pay | Admitting: Family Medicine

## 2017-09-30 ENCOUNTER — Ambulatory Visit: Payer: Medicare Other | Admitting: Family Medicine

## 2017-09-30 ENCOUNTER — Other Ambulatory Visit: Payer: Self-pay

## 2017-09-30 VITALS — BP 110/64 | HR 79 | Temp 97.8°F | Ht 63.0 in | Wt 202.6 lb

## 2017-09-30 DIAGNOSIS — E1165 Type 2 diabetes mellitus with hyperglycemia: Secondary | ICD-10-CM

## 2017-09-30 DIAGNOSIS — E785 Hyperlipidemia, unspecified: Secondary | ICD-10-CM | POA: Diagnosis not present

## 2017-09-30 DIAGNOSIS — Z0000 Encounter for general adult medical examination without abnormal findings: Secondary | ICD-10-CM

## 2017-09-30 LAB — POCT GLYCOSYLATED HEMOGLOBIN (HGB A1C): Hemoglobin A1C: 10.6

## 2017-09-30 MED ORDER — METFORMIN HCL 1000 MG PO TABS: 1000.0000 mg | ORAL_TABLET | Freq: Two times a day (BID) | ORAL | 0 refills | Status: DC

## 2017-09-30 MED ORDER — ASPIRIN EC 81 MG PO TBEC: 81.0000 mg | DELAYED_RELEASE_TABLET | Freq: Every day | ORAL | 0 refills | Status: AC

## 2017-09-30 MED ORDER — BLOOD GLUCOSE METER KIT: PACK | 0 refills | Status: AC

## 2017-09-30 MED ORDER — SITAGLIPTIN PHOSPHATE 50 MG PO TABS: 50.0000 mg | ORAL_TABLET | Freq: Two times a day (BID) | ORAL | 0 refills | Status: DC

## 2017-09-30 MED ORDER — PRAVASTATIN SODIUM 40 MG PO TABS: 40.0000 mg | ORAL_TABLET | Freq: Every day | ORAL | 0 refills | Status: DC

## 2017-09-30 NOTE — Progress Notes (Signed)
    Subjective:  Jacqueline Bush is a 62 y.o. female who presents to the [facility] today with a chief complaint of diabetes check up and med titration.   HPI: Due to insurance has been having issues flling the meds she was on prior to establishing (metofrmin/januvia combo).  She was prescribed metformin and [place] seperately but never filled the [place].  She can "feel" that her blood sugar has been high but has had no DKA symptoms.  Would like to get glucometer to check her sugar when it feels "off"  She would also like her meds changed to 90days Rx, she is going home to [place] for a few months  Objective:  Physical Exam: BP 110/64   Pulse 79   Temp 97.8 F (36.6 C) (Oral)   Ht  (1.6 m)   Wt 202 lb 9.6 oz (91.9 kg)   SpO2 96%   BMI 35.89 kg/m   Gen: NAD, resting comfortably CV: RRR with 2/6 murmur noted Pulm: NWOB, CTAB with no crackles, wheezes, or rhonchi GI: Soft, Nontender, Nondistended. Skin: warm, dry Neuro: grossly normal, moves all extremities Psych: Normal affect and thought content  Results for orders placed or performed in visit on 09/30/17 (from the past 72 hour(s))  HgB A1c     Status: Abnormal   Collection Time: 09/30/17 11:14 AM  Result Value Ref Range   Hemoglobin A1C 10.6      Assessment/Plan:  Hyperlipidemia Refilled chronic statin  DM (diabetes mellitus), type 2 (HCC) Due to pharmacy/patient confusion she never picked up the [place] and has only been on the metformin for the last few months, A1C is up to 10.6  Advised to watch diet, take metformin and fill januvia to start taking it.  Will recheck in 3 months   Marthenia Rolling, DO FAMILY MEDICINE RESIDENT - PGY1 09/30/2017 3:29 PM

## 2017-09-30 NOTE — Assessment & Plan Note (Signed)
Due to pharmacy/patient confusion she never picked up the [place] and has only been on the metformin for the last few months, A1C is up to 10.6  Advised to watch diet, take metformin and fill januvia to start taking it.  Will recheck in 3 months

## 2017-09-30 NOTE — Assessment & Plan Note (Signed)
Refilled chronic statin

## 2017-09-30 NOTE — Patient Instructions (Signed)
It was a pleasure to see you today! Thank you for choosing [facility] for your primary care. Jacqueline Bush was seen for DM check and med refill. Come back to the clinic if you have any new concerns, and go to the emergency room if you have any life threatening symptoms.  Please let us know if you need anything before your trip.    If we did any lab work today, and the results require attention, either me or my nurse will get in touch with you. If everything is normal, you will get a letter in mail and a message via . If you don't hear from [place] in two weeks, please give [place] a call. Otherwise, we look forward to seeing you again at your next visit. If you have any questions or concerns before then, please call the clinic at [phone].  Please bring all your medications to every doctors visit  Sign up for My Chart to have easy access to your labs results, and communication with your Primary care physician.    Please check-out at the front desk before leaving the clinic.    Best,  Dr. Marthenia Rolling FAMILY MEDICINE RESIDENT - PGY1 09/30/2017 11:42 AM

## 2018-02-21 ENCOUNTER — Other Ambulatory Visit: Payer: Self-pay | Admitting: Family Medicine

## 2018-02-21 DIAGNOSIS — E1165 Type 2 diabetes mellitus with hyperglycemia: Secondary | ICD-10-CM

## 2018-02-21 DIAGNOSIS — E785 Hyperlipidemia, unspecified: Secondary | ICD-10-CM

## 2018-03-27 ENCOUNTER — Ambulatory Visit: Payer: Medicare Other | Admitting: Family Medicine

## 2018-05-10 ENCOUNTER — Other Ambulatory Visit: Payer: Self-pay | Admitting: Family Medicine

## 2018-05-10 DIAGNOSIS — E1165 Type 2 diabetes mellitus with hyperglycemia: Secondary | ICD-10-CM

## 2018-05-10 DIAGNOSIS — E785 Hyperlipidemia, unspecified: Secondary | ICD-10-CM

## 2018-05-29 ENCOUNTER — Encounter: Payer: Self-pay | Admitting: Family Medicine

## 2018-05-29 ENCOUNTER — Ambulatory Visit (INDEPENDENT_AMBULATORY_CARE_PROVIDER_SITE_OTHER): Payer: Medicare Other | Admitting: Family Medicine

## 2018-05-29 ENCOUNTER — Other Ambulatory Visit: Payer: Self-pay

## 2018-05-29 VITALS — BP 126/70 | HR 75 | Temp 97.9°F | Ht 63.0 in | Wt 211.6 lb

## 2018-05-29 DIAGNOSIS — Z0000 Encounter for general adult medical examination without abnormal findings: Secondary | ICD-10-CM

## 2018-05-29 DIAGNOSIS — M545 Low back pain, unspecified: Secondary | ICD-10-CM | POA: Insufficient documentation

## 2018-05-29 DIAGNOSIS — G8929 Other chronic pain: Secondary | ICD-10-CM

## 2018-05-29 DIAGNOSIS — R002 Palpitations: Secondary | ICD-10-CM

## 2018-05-29 DIAGNOSIS — I10 Essential (primary) hypertension: Secondary | ICD-10-CM

## 2018-05-29 DIAGNOSIS — E785 Hyperlipidemia, unspecified: Secondary | ICD-10-CM

## 2018-05-29 DIAGNOSIS — E119 Type 2 diabetes mellitus without complications: Secondary | ICD-10-CM

## 2018-05-29 LAB — POCT GLYCOSYLATED HEMOGLOBIN (HGB A1C): HBA1C, POC (CONTROLLED DIABETIC RANGE): 9.4 % — AB (ref 0.0–7.0)

## 2018-05-29 MED ORDER — GABAPENTIN 100 MG PO CAPS: 100.0000 mg | ORAL_CAPSULE | Freq: Three times a day (TID) | ORAL | 3 refills | Status: DC | PRN

## 2018-05-29 MED ORDER — EMPAGLIFLOZIN 10 MG PO TABS: 10.0000 mg | ORAL_TABLET | Freq: Every day | ORAL | 3 refills | Status: DC

## 2018-05-29 NOTE — Assessment & Plan Note (Addendum)
I am unsure about the etiology of her pain.  Does not appear to be musculoskeletal during my exam, since patient is nontender to palpation and has a full range of motion.  Could be neuropathic pain given her diabetes.  Will try gabapentin 100 mg 3 times daily as needed.  Also recommended Tylenol 3 times daily  Gave patient a handout on back exercises and stretches, which her daughter can translate for her.

## 2018-05-29 NOTE — Assessment & Plan Note (Signed)
Will obtain lipid panel today.

## 2018-05-29 NOTE — Assessment & Plan Note (Signed)
A1c has improved from 10.6 to 9.4 today, but diabetes is still uncontrolled.  Will encourage patient to continue metformin 1000 mg twice daily and start Jardiance 10 mg daily.  May need to increase the dose of Jardiance after she has assessed in 3 months.  We will stop Januvia.  Discussed with patient the side effects of Jardiance, including increased incidence of yeast infections and UTIs.  Patient is agreeable to this change.

## 2018-05-29 NOTE — Patient Instructions (Addendum)
It was nice meeting you today Jacqueline Bush!  For your back and side pain, I am attaching some back exercises and stretches that may improve your pain.  You can also try Tylenol 3 times per day and gabapentin 3 times per day as needed.  For your diabetes, we are stopping the Januvia and starting Jardiance.  Please continue your metformin.  I would like to see you back in 3 months to check to see how your diabetes is doing.  Please let us know if you continue to have these feelings of your heart fluttering.  This may need further work-up in the future.  We are checking several labs today.  I will let you know if any of these labs are abnormal.  If you have any questions or concerns, please feel free to call the clinic.   Be well,  Dr. Frances FurbishWinfrey  Back Exercises If you have pain in your back, do these exercises 2-3 times each day or as told by your doctor. When the pain goes away, do the exercises once each day, but repeat the steps more times for each exercise (do more repetitions). If you do not have pain in your back, do these exercises once each day or as told by your doctor. Exercises Single Knee to Chest Do these steps 3-5 times in a row for each leg: 1. Lie on your back on a firm bed or the floor with your legs stretched out. 2. Bring one knee to your chest. 3. Hold your knee to your chest by grabbing your knee or thigh. 4. Pull on your knee until you feel a gentle stretch in your lower back. 5. Keep doing the stretch for 10-30 seconds. 6. Slowly let go of your leg and straighten it. Pelvic Tilt Do these steps 5-10 times in a row: 1. Lie on your back on a firm bed or the floor with your legs stretched out. 2. Bend your knees so they point up to the ceiling. Your feet should be flat on the floor. 3. Tighten your lower belly (abdomen) muscles to press your lower back against the floor. This will make your tailbone point up to the ceiling instead of pointing down to your feet or the  floor. 4. Stay in this position for 5-10 seconds while you gently tighten your muscles and breathe evenly. Cat-Cow Do these steps until your lower back bends more easily: 1. Get on your hands and knees on a firm surface. Keep your hands under your shoulders, and keep your knees under your hips. You may put padding under your knees. 2. Let your head hang down, and make your tailbone point down to the floor so your lower back is round like the back of a cat. 3. Stay in this position for 5 seconds. 4. Slowly lift your head and make your tailbone point up to the ceiling so your back hangs low (sags) like the back of a cow. 5. Stay in this position for 5 seconds.  Press-Ups Do these steps 5-10 times in a row: 1. Lie on your belly (face-down) on the floor. 2. Place your hands near your head, about shoulder-width apart. 3. While you keep your back relaxed and keep your hips on the floor, slowly straighten your arms to raise the top half of your body and lift your shoulders. Do not use your back muscles. To make yourself more comfortable, you may change where you place your hands. 4. Stay in this position for 5 seconds. 5.  Slowly return to lying flat on the floor.  Bridges Do these steps 10 times in a row: 1. Lie on your back on a firm surface. 2. Bend your knees so they point up to the ceiling. Your feet should be flat on the floor. 3. Tighten your butt muscles and lift your butt off of the floor until your waist is almost as high as your knees. If you do not feel the muscles working in your butt and the back of your thighs, slide your feet 1-2 inches farther away from your butt. 4. Stay in this position for 3-5 seconds. 5. Slowly lower your butt to the floor, and let your butt muscles relax. If this exercise is too easy, try doing it with your arms crossed over your chest. Belly Crunches Do these steps 5-10 times in a row: 1. Lie on your back on a firm bed or the floor with your legs stretched  out. 2. Bend your knees so they point up to the ceiling. Your feet should be flat on the floor. 3. Cross your arms over your chest. 4. Tip your chin a little bit toward your chest but do not bend your neck. 5. Tighten your belly muscles and slowly raise your chest just enough to lift your shoulder blades a tiny bit off of the floor. 6. Slowly lower your chest and your head to the floor. Back Lifts Do these steps 5-10 times in a row: 1. Lie on your belly (face-down) with your arms at your sides, and rest your forehead on the floor. 2. Tighten the muscles in your legs and your butt. 3. Slowly lift your chest off of the floor while you keep your hips on the floor. Keep the back of your head in line with the curve in your back. Look at the floor while you do this. 4. Stay in this position for 3-5 seconds. 5. Slowly lower your chest and your face to the floor. Contact a doctor if:  Your back pain gets a lot worse when you do an exercise.  Your back pain does not lessen 2 hours after you exercise. If you have any of these problems, stop doing the exercises. Do not do them again unless your doctor says it is okay. Get help right away if:  You have sudden, very bad back pain. If this happens, stop doing the exercises. Do not do them again unless your doctor says it is okay. This information is not intended to replace advice given to you by your health care provider. Make sure you discuss any questions you have with your health care provider. Document Released: 06/22/2010 Document Revised: 02/11/2018 Document Reviewed: 07/14/2014 Elsevier Interactive Patient Education  [employer]2019 [employer]

## 2018-05-29 NOTE — Assessment & Plan Note (Signed)
Will obtain hepatitis C screen today.

## 2018-05-29 NOTE — Progress Notes (Signed)
Subjective:    Jacqueline Bush - 62 y.o. female MRN [id number]  Date of birth: 03/19/1956  CC:  Jacqueline Ohmzdihar Sonoda is here for back pain, heart fluttering, and diabetes f/u.  HPI: Back/side pain - two months' duration - intermittent - insidious onset, no precipitating factors identified - exacerbating factors: constipation - alleviating factors: she has not tried any medicine, has not tried any exercises or heating pads - does not think it is related to her muscles - is not worsening since the pain started - does not radiate - sometimes has tingling in her feet  Heart fluttering - has been occurring for 1-2 months - has occurred 2-3 times during the past 2 months - no shortness of breath or pain when these occur  Diabetes f/u  - is taking metformin 1000 mg BID and januvia 50 mg twice daily -Does not check her blood sugars  Health Maintenance:  Health Maintenance Due  Topic Date Due  . Hepatitis C Screening  010/17/1957  . PNEUMOCOCCAL POLYSACCHARIDE VACCINE AGE 56-64 HIGH RISK  01/15/1958  . FOOT EXAM  01/15/1966  . OPHTHALMOLOGY EXAM  01/15/1966  . URINE MICROALBUMIN  01/15/1966  . HIV Screening  01/16/1971  . TETANUS/TDAP  01/16/1975  . PAP SMEAR-Modifier  01/15/1977  . MAMMOGRAM  01/15/2006  . COLONOSCOPY  01/15/2006  . INFLUENZA VACCINE  01/01/2018  . HEMOGLOBIN A1C  04/01/2018    -  reports that she has never smoked. She has never used smokeless tobacco. - Review of Systems: Per HPI. - Past Medical History: Patient Active Problem List   Diagnosis Date Noted  . Chronic bilateral low back pain without sciatica 05/29/2018  . Systolic murmur 07/23/2017  . Preventative health care 07/23/2017  . Hypertension 10/13/2011  . DM (diabetes mellitus), type 2 (HCC) 10/13/2011  . Memory difficulty 10/13/2011  . Hyperlipidemia 10/13/2011   - Medications: reviewed and updated   Objective:   Physical Exam BP 126/70   Pulse 75   Temp 97.9 F (36.6 C) (Oral)   Ht 5\' 3"   (1.6 m)   Wt 211 lb 9.6 oz (96 kg)   SpO2 97%   BMI 37.48 kg/m  Gen: NAD, alert, cooperative with exam, well-appearing CV: regular rhythm, normal rate, good S1/S2, no murmur Resp: CTABL, no wheezes, non-labored Musculoskeletal: Full range of motion of lumbar spine, nontender to palpation of spinous processes and paraspinal muscles bilaterally, no CVA tenderness        Assessment & Plan:   DM (diabetes mellitus), type 2 (HCC) A1c has improved from 10.6 to 9.4 today, but diabetes is still uncontrolled.  Will encourage patient to continue metformin 1000 mg twice daily and start Jardiance 10 mg daily.  May need to increase the dose of Jardiance after she has assessed in 3 months.  We will stop Januvia.  Discussed with patient the side effects of Jardiance, including increased incidence of yeast infections and UTIs.  Patient is agreeable to this change.  Hyperlipidemia Will obtain lipid panel today.  Preventative health care Will obtain hepatitis C screen today.  Hypertension Will check CMP since patient is taking ACE inhibitor and diuretic.  Chronic bilateral low back pain without sciatica I am unsure about the etiology of her pain.  Does not appear to be musculoskeletal during my exam, since patient is nontender to palpation and has a full range of motion.  Could be neuropathic pain given her diabetes.  Will try gabapentin 100 mg 3 times daily as needed.  Also recommended  Tylenol 3 times daily  Gave patient a handout on back exercises and stretches, which her daughter can translate for her.    Lezlie OctaveAmanda Winfrey, M.D. 05/29/2018, 11:00 AM PGY-2, [facility]

## 2018-05-29 NOTE — Assessment & Plan Note (Signed)
Will check CMP since patient is taking ACE inhibitor and diuretic.

## 2018-05-30 LAB — LIPID PANEL
CHOLESTEROL TOTAL: 226 mg/dL — AB (ref 100–199)
Chol/HDL Ratio: 3.8 ratio (ref 0.0–4.4)
HDL: 59 mg/dL (ref >39)
LDL CALC: 139 mg/dL — AB (ref 0–99)
Triglycerides: 141 mg/dL (ref 0–149)
VLDL Cholesterol Cal: 28 mg/dL (ref 5–40)

## 2018-05-30 LAB — COMPREHENSIVE METABOLIC PANEL
ALT: 15 IU/L (ref 0–32)
AST: 17 IU/L (ref 0–40)
Albumin/Globulin Ratio: 1.5 (ref 1.2–2.2)
Albumin: 4 g/dL (ref 3.6–4.8)
Alkaline Phosphatase: 73 IU/L (ref 39–117)
BUN/Creatinine Ratio: 17 (ref 12–28)
BUN: 16 mg/dL (ref 8–27)
Bilirubin Total: 0.5 mg/dL (ref 0.0–1.2)
CO2: 23 mmol/L (ref 20–29)
Calcium: 9.6 mg/dL (ref 8.7–10.3)
Chloride: 98 mmol/L (ref 96–106)
Creatinine, Ser: 0.95 mg/dL (ref 0.57–1.00)
GFR calc Af Amer: 74 mL/min/{1.73_m2} (ref >59)
GFR calc non Af Amer: 64 mL/min/{1.73_m2} (ref >59)
Globulin, Total: 2.7 g/dL (ref 1.5–4.5)
Glucose: 240 mg/dL — ABNORMAL HIGH (ref 65–99)
Potassium: 4.5 mmol/L (ref 3.5–5.2)
Sodium: 138 mmol/L (ref 134–144)
Total Protein: 6.7 g/dL (ref 6.0–8.5)

## 2018-05-30 LAB — CBC
Hematocrit: 34.8 % (ref 34.0–46.6)
Hemoglobin: 11.6 g/dL (ref 11.1–15.9)
MCH: 27.4 pg (ref 26.6–33.0)
MCHC: 33.3 g/dL (ref 31.5–35.7)
MCV: 82 fL (ref 79–97)
Platelets: 176 10*3/uL (ref 150–450)
RBC: 4.24 x10E6/uL (ref 3.77–5.28)
RDW: 12.8 % (ref 12.3–15.4)
WBC: 4.5 10*3/uL (ref 3.4–10.8)

## 2018-05-30 LAB — TSH: TSH: 1.67 u[IU]/mL (ref 0.450–4.500)

## 2018-05-30 LAB — HEPATITIS C ANTIBODY: Hep C Virus Ab: <0.1 s/co ratio (ref 0.0–0.9)

## 2018-06-01 ENCOUNTER — Other Ambulatory Visit: Payer: Self-pay | Admitting: Family Medicine

## 2018-06-01 DIAGNOSIS — E785 Hyperlipidemia, unspecified: Secondary | ICD-10-CM

## 2018-06-01 MED ORDER — PRAVASTATIN SODIUM 80 MG PO TABS: 80.0000 mg | ORAL_TABLET | Freq: Every day | ORAL | 3 refills | Status: DC

## 2018-06-10 ENCOUNTER — Telehealth: Payer: Self-pay | Admitting: *Deleted

## 2018-06-10 NOTE — Telephone Encounter (Signed)
-----   Message from Ellwood Dense, DO sent at 06/01/2018  8:42 AM EST ----- Please let patient know her LDL is elevated, she should increase her statin dose to pravastatin 80mg . She can take 2 of her 40 tablets until she runs out and then pick up 80mg  (I will send in Rx). Her CBC, TSH, Hep C screening, CMP are all normal. She should follow up with her PCP as scheduled.

## 2018-06-10 NOTE — Telephone Encounter (Signed)
LVM to call office to inform pt of below. Zimmerman Rumple, April D, CMA  

## 2018-06-12 NOTE — Telephone Encounter (Signed)
Informed pt husband of below and he said that she had already started the medication. Lamonte Sakai, Kerry Odonohue D, [place]

## 2019-06-07 ENCOUNTER — Other Ambulatory Visit: Payer: Self-pay

## 2019-06-07 DIAGNOSIS — E1169 Type 2 diabetes mellitus with other specified complication: Secondary | ICD-10-CM

## 2019-06-07 MED ORDER — EMPAGLIFLOZIN 10 MG PO TABS: 10.0000 mg | ORAL_TABLET | Freq: Every day | ORAL | 0 refills | Status: DC

## 2019-06-09 ENCOUNTER — Ambulatory Visit (INDEPENDENT_AMBULATORY_CARE_PROVIDER_SITE_OTHER): Payer: Medicare Other | Admitting: Family Medicine

## 2019-06-09 ENCOUNTER — Ambulatory Visit: Payer: Medicaid Other | Admitting: Family Medicine

## 2019-06-09 ENCOUNTER — Other Ambulatory Visit: Payer: Self-pay

## 2019-06-09 VITALS — BP 106/60 | HR 80 | Wt 204.8 lb

## 2019-06-09 DIAGNOSIS — E1165 Type 2 diabetes mellitus with hyperglycemia: Secondary | ICD-10-CM

## 2019-06-09 DIAGNOSIS — G8929 Other chronic pain: Secondary | ICD-10-CM

## 2019-06-09 DIAGNOSIS — E785 Hyperlipidemia, unspecified: Secondary | ICD-10-CM

## 2019-06-09 DIAGNOSIS — N898 Other specified noninflammatory disorders of vagina: Secondary | ICD-10-CM

## 2019-06-09 DIAGNOSIS — M25561 Pain in right knee: Secondary | ICD-10-CM

## 2019-06-09 DIAGNOSIS — I10 Essential (primary) hypertension: Secondary | ICD-10-CM

## 2019-06-09 LAB — POCT GLYCOSYLATED HEMOGLOBIN (HGB A1C): HbA1c, POC (controlled diabetic range): 9.5 % — AB (ref 0.0–7.0)

## 2019-06-09 MED ORDER — HYDROCHLOROTHIAZIDE 12.5 MG PO CAPS: 12.5000 mg | ORAL_CAPSULE | Freq: Every day | ORAL | 3 refills | Status: DC

## 2019-06-09 MED ORDER — METFORMIN HCL 1000 MG PO TABS: ORAL_TABLET | ORAL | 3 refills | Status: DC

## 2019-06-09 MED ORDER — EMPAGLIFLOZIN 25 MG PO TABS: 25.0000 mg | ORAL_TABLET | Freq: Every day | ORAL | 3 refills | Status: DC

## 2019-06-09 MED ORDER — FLUCONAZOLE 150 MG PO TABS: 150.0000 mg | ORAL_TABLET | Freq: Once | ORAL | 0 refills | Status: AC

## 2019-06-09 MED ORDER — PRAVASTATIN SODIUM 80 MG PO TABS: 40.0000 mg | ORAL_TABLET | Freq: Every day | ORAL | 3 refills | Status: DC

## 2019-06-09 MED ORDER — CANDESARTAN CILEXETIL 16 MG PO TABS: 16.0000 mg | ORAL_TABLET | Freq: Every day | ORAL | 3 refills | Status: DC

## 2019-06-09 NOTE — Patient Instructions (Signed)
Today we talked about your knee pain.  I think there is okay to get an x-ray of your knee, that order has been sent to [facility] imaging.  Their address is [address], their website address is [email or web address] .   These x-rays should help the sports medicine doctors, who I have sent a referral to, give you a better evaluation.  They will likely use a type of picture called in ultrasound to take a look at other structures of your knee besides the bones.  I have also refilled your diabetes medicines.  We did go up on the Jardiance because your diabetes is still uncontrolled.  I refilled your hypertension meds and I gave you 1 dose of Diflucan for your vaginal itching.  I know that you did not want to have a physical exam today but if you find that the Diflucan does not work you can call and schedule a exam with one of our female doctors to get a vaginal exam done.

## 2019-06-09 NOTE — Progress Notes (Signed)
**Patient was offered arabic interpreter and preferred to have her son interpret for her, she was reminded she always has the option of professional interpretor services** Subjective:  Jacqueline Bush is a 64 y.o. female who presents to the [facility] today with a chief complaint of med refill and knee pain.   HPI: DM (diabetes mellitus), type 2 (HCC) DM uncontrolled but stable at 9.5 from prior.  Patient does not want insulin or nutrition  Hypertension Patient never actually took the prescribed lisinopril/hctz.  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  Vagina itching Patient refuses exam but says she has some itching consistent with yeast infection.  No dysuria/rash,   Chronic pain of right knee Intermittent pain for "years" in right knee.  Says it will go months without hurting and then have pain for a few days/weeks at a time where it feels like her knee is "loose".  No falls, no injury remembered, no new swelling/warmth, no "pop".  Claims recent xray (not visible to me) showed no arthritis  Objective:  Physical Exam: BP 106/60   Pulse 80   Wt 204 lb 12.8 oz (92.9 kg)   SpO2 97%   BMI 36.28 kg/m   Gen: NAD, conversing comfortably, gait with mild limp CV: RRR with 2/6 murmur Pulm: NWOB, CTAB with no crackles, wheezes, or rhonchi MSK: no edema, cyanosis, or clubbing noted.  Negative right  knee exam for valgus/varus lax, ant/post drawer, apley/murray.  No edema.  Can ambulate with limp Skin: warm, dry Neuro: grossly normal, moves all extremities Psych: Normal affect and thought content  Results for orders placed or performed in visit on 06/09/19 (from the past 72 hour(s))  HgB A1c     Status: Abnormal   Collection Time: 06/09/19  1:56 PM  Result Value Ref Range   Hemoglobin A1C     HbA1c POC (<> result, manual entry)     HbA1c, POC (prediabetic range)     HbA1c, POC (controlled diabetic range) 9.5 (A) 0.0 - 7.0 %      Assessment/Plan:  DM (diabetes mellitus), type 2 (HCC) DM uncontrolled but stable at 9.5 from prior.  Patient does not want insulin or nutrition  They take their meds as prescribed and are willing to try higher dose of jardiance up to 25mg  daily.  BMP is ordered but they left prior to getting it drawn, will draw at next f/u.  Hypertension Patient never actually took the prescribed lisinopril/hctz.  [place] went [place] and [place] a and want that prescribed instead.  Well controlled on the candesartan/hctz so I'm willing to prescribe it but I can't find that formulation in our system so will prescribe the two medications individually.  Vagina itching Patient refuses exam but says she has some itching consisten with yeast infection.  No dysuria/rash, will order diflucan and have advised patient to reschedule with female doctor for exam.  Hyperlipidemia Patient left prior to draw, lab ordered as future  Refill chronic pravastatin  Chronic pain of right knee Intermittent pain for "years" in right knee.  Says it will go months without hurting and then have pain for a few days/weeks at a time where it feels like her knee is "loose".  No falls, no injury remembered, no new swelling/warmth, no "pop".  Claims recent xray (not visible to me) showed no arthritis  Negative knee exam for valgus/varus lax, ant/post drawer, apley/murray.  No edema.  Can ambulate with limp  Plan is  ap/lat/sunrise knee xr and refer to sports   Sherene Sires, [place] - PGY3 06/10/2019 10:09 AM

## 2019-06-10 DIAGNOSIS — N898 Other specified noninflammatory disorders of vagina: Secondary | ICD-10-CM | POA: Insufficient documentation

## 2019-06-10 DIAGNOSIS — G8929 Other chronic pain: Secondary | ICD-10-CM | POA: Insufficient documentation

## 2019-06-10 DIAGNOSIS — M25561 Pain in right knee: Secondary | ICD-10-CM | POA: Insufficient documentation

## 2019-06-10 NOTE — Assessment & Plan Note (Signed)
Intermittent pain for "years" in right knee.  Says it will go months without hurting and then have pain for a few days/weeks at a time where it feels like her knee is "loose".  No falls, no injury remembered, no new swelling/warmth, no "pop".  Claims recent xray (not visible to me) showed no arthritis  Negative knee exam for valgus/varus lax, ant/post drawer, apley/murray.  No edema.  Can ambulate with limp  Plan is ap/lat/sunrise knee xr and refer to sports

## 2019-06-10 NOTE — Assessment & Plan Note (Signed)
DM uncontrolled but stable at 9.5 from prior.  Patient does not want insulin or nutrition  They take their meds as prescribed and are willing to try higher dose of jardiance up to 25mg  daily.  BMP is ordered but they left prior to getting it drawn, will draw at next f/u.

## 2019-06-10 NOTE — Assessment & Plan Note (Signed)
Patient never actually took the prescribed lisinopril/hctz.  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  Well controlled on the candesartan/hctz so I'm willing to prescribe it but I can't find that formulation in our system so will prescribe the two medications individually.

## 2019-06-10 NOTE — Assessment & Plan Note (Signed)
Patient left prior to draw, lab ordered as future  Refill chronic pravastatin

## 2019-06-10 NOTE — Assessment & Plan Note (Signed)
Patient refuses exam but says she has some itching consisten with yeast infection.  No dysuria/rash, will order diflucan and have advised patient to reschedule with female doctor for exam.

## 2019-06-18 ENCOUNTER — Telehealth: Payer: Self-pay | Admitting: *Deleted

## 2019-06-18 NOTE — Telephone Encounter (Signed)
Candesartan not covered by Medicaid.  Please see below of formulary.  Let "RN Team" know if you are changing to covered medications or would like to pursue a PA. Jone Baseman, CMA

## 2019-06-22 NOTE — Telephone Encounter (Signed)
Spoke to son. He isn't with his mom. He is at work and asked if we can call him tomorrow morning and he will be with her to ask her questions. Sunday Spillers, CMA

## 2019-07-01 NOTE — Telephone Encounter (Signed)
Talked to son. He said he can be around his mom around 1:30. I told him I would try to call back then. Sunday Spillers, CMA

## 2019-07-01 NOTE — Telephone Encounter (Signed)
Called son, no answer. Will try again. Sunday Spillers, CMA

## 2020-07-04 ENCOUNTER — Other Ambulatory Visit: Payer: Self-pay | Admitting: *Deleted

## 2020-07-04 DIAGNOSIS — E1165 Type 2 diabetes mellitus with hyperglycemia: Secondary | ICD-10-CM

## 2020-07-04 DIAGNOSIS — E785 Hyperlipidemia, unspecified: Secondary | ICD-10-CM

## 2020-07-04 MED ORDER — PRAVASTATIN SODIUM 80 MG PO TABS: 40.0000 mg | ORAL_TABLET | Freq: Every day | ORAL | 0 refills | Status: DC

## 2020-07-04 MED ORDER — METFORMIN HCL 1000 MG PO TABS: ORAL_TABLET | ORAL | 0 refills | Status: DC

## 2020-07-04 MED ORDER — EMPAGLIFLOZIN 25 MG PO TABS: 25.0000 mg | ORAL_TABLET | Freq: Every day | ORAL | 0 refills | Status: DC

## 2020-07-06 ENCOUNTER — Ambulatory Visit: Payer: Medicaid Other | Admitting: Family Medicine

## 2020-07-14 ENCOUNTER — Ambulatory Visit (INDEPENDENT_AMBULATORY_CARE_PROVIDER_SITE_OTHER): Payer: Self-pay | Admitting: Family Medicine

## 2020-07-14 ENCOUNTER — Other Ambulatory Visit: Payer: Self-pay

## 2020-07-14 DIAGNOSIS — E119 Type 2 diabetes mellitus without complications: Secondary | ICD-10-CM

## 2020-07-14 DIAGNOSIS — I10 Essential (primary) hypertension: Secondary | ICD-10-CM

## 2020-07-14 DIAGNOSIS — E1165 Type 2 diabetes mellitus with hyperglycemia: Secondary | ICD-10-CM

## 2020-07-14 DIAGNOSIS — R413 Other amnesia: Secondary | ICD-10-CM

## 2020-07-14 DIAGNOSIS — E785 Hyperlipidemia, unspecified: Secondary | ICD-10-CM

## 2020-07-14 DIAGNOSIS — Z0000 Encounter for general adult medical examination without abnormal findings: Secondary | ICD-10-CM

## 2020-07-14 LAB — POCT GLYCOSYLATED HEMOGLOBIN (HGB A1C): Hemoglobin A1C: 9.7 % — AB (ref 4.0–5.6)

## 2020-07-14 MED ORDER — PRAVASTATIN SODIUM 80 MG PO TABS: 40.0000 mg | ORAL_TABLET | Freq: Every day | ORAL | 0 refills | Status: DC

## 2020-07-14 MED ORDER — EMPAGLIFLOZIN 25 MG PO TABS: 25.0000 mg | ORAL_TABLET | Freq: Every day | ORAL | 0 refills | Status: DC

## 2020-07-14 MED ORDER — METFORMIN HCL 1000 MG PO TABS: ORAL_TABLET | ORAL | 0 refills | Status: DC

## 2020-07-14 NOTE — Patient Instructions (Addendum)
It was wonderful to meet you today. Thank you for allowing me to be a part of your care. Below is a short summary of what we discussed at your visit today:  Diabetes   Please come back in one month to go over your blood work from today and check on your blood sugar. I will not prescribe any new medication today. Please bring all your medication bottles with you to your next appointment.                   .      .           .  After that, I need to see you every three months until we get your blood sugar under control.                  .  If you have any questions or concerns, please do not hesitate to contact us via phone or MyChart message.                   MyChart.  Fayette Pho, MD

## 2020-07-14 NOTE — Progress Notes (Addendum)
    SUBJECTIVE:   CHIEF COMPLAINT / HPI:   Diabetes check in Jacqueline Bush presents to the clinic with her daughter and two grandchildren for a diabetes check in. The patient and her daughter decline an interpreter, preferring to have the daughter interpret. Per our records, she was last seen in our clinic about a year ago on 06/18/2019. Today's A1c 9.7, unchanged from measurements years prior (ranged from 9.4-10.6 since 09/30/2017). The daughter presents the script bottle label from metformin, jardiance, and pravastatin saying these were all out and they needed refilled. Could not tell me how long they were out. Also had difficulty telling me how she took different medications, i.e. daily vs BID. This visit was complicated by communication difficulties due to language barrier and boisterous children in the room.   PERTINENT  PMH / PSH: T2DM, HTN, HLD, memory difficulty, chronic bilateral low back pain, chronic right knee pain  OBJECTIVE:   There were no vitals taken for this visit.  PHQ-9:  Depression screen [facility] 2/9 06/09/2019 05/29/2018 09/30/2017  Decreased Interest 0 0 0  Down, Depressed, Hopeless 0 0 0  PHQ - 2 Score 0 0 0     GAD-7: No flowsheet data found.   Physical Exam General: Awake, alert, oriented, no acute distress Respiratory: Unlabored respirations, speaking in full sentences, no respiratory distress Extremities: Moving all extremities spontaneously Neuro: Cranial nerves II through X grossly intact Psych: Normal insight and judgement   ASSESSMENT/PLAN:   DM (diabetes mellitus), type 2 (HCC) Diabetes uncontrolled. A1c today 9.7, unchanged from previous measurements dating back to 2019. Unclear medication adherence. No recent labs, has been one year since last visit.  - CBC, CMP, direct LDL today - f/u appt one month (bring all med bottles, leave children at home)  Hyperlipidemia - direct LDL today as she has history of infrequent appointments and is not fasting at this  time - f/u one month     Fayette Pho, MD [facility]

## 2020-07-15 LAB — COMPREHENSIVE METABOLIC PANEL
ALT: 12 IU/L (ref 0–32)
AST: 16 IU/L (ref 0–40)
Albumin/Globulin Ratio: 1.5 (ref 1.2–2.2)
Albumin: 4.3 g/dL (ref 3.8–4.8)
Alkaline Phosphatase: 88 IU/L (ref 44–121)
BUN/Creatinine Ratio: 17 (ref 12–28)
BUN: 19 mg/dL (ref 8–27)
Bilirubin Total: 0.3 mg/dL (ref 0.0–1.2)
CO2: 20 mmol/L (ref 20–29)
Calcium: 9.3 mg/dL (ref 8.7–10.3)
Chloride: 100 mmol/L (ref 96–106)
Creatinine, Ser: 1.09 mg/dL — ABNORMAL HIGH (ref 0.57–1.00)
GFR calc Af Amer: 62 mL/min/{1.73_m2} (ref >59)
GFR calc non Af Amer: 54 mL/min/{1.73_m2} — ABNORMAL LOW (ref >59)
Globulin, Total: 2.8 g/dL (ref 1.5–4.5)
Glucose: 189 mg/dL — ABNORMAL HIGH (ref 65–99)
Potassium: 4.2 mmol/L (ref 3.5–5.2)
Sodium: 139 mmol/L (ref 134–144)
Total Protein: 7.1 g/dL (ref 6.0–8.5)

## 2020-07-15 LAB — CBC
Hematocrit: 37.5 % (ref 34.0–46.6)
Hemoglobin: 12.3 g/dL (ref 11.1–15.9)
MCH: 27.4 pg (ref 26.6–33.0)
MCHC: 32.8 g/dL (ref 31.5–35.7)
MCV: 84 fL (ref 79–97)
Platelets: 174 10*3/uL (ref 150–450)
RBC: 4.49 x10E6/uL (ref 3.77–5.28)
RDW: 12.7 % (ref 11.7–15.4)
WBC: 6.8 10*3/uL (ref 3.4–10.8)

## 2020-07-15 LAB — LDL CHOLESTEROL, DIRECT: LDL Direct: 117 mg/dL — ABNORMAL HIGH (ref 0–99)

## 2020-07-16 NOTE — Assessment & Plan Note (Addendum)
Diabetes uncontrolled. A1c today 9.7, unchanged from previous measurements dating back to 2019. Unclear medication adherence. No recent labs, has been one year since last visit.  - CBC, CMP, direct LDL today - f/u appt one month (bring all med bottles, leave children at home)

## 2020-07-16 NOTE — Assessment & Plan Note (Signed)
-   direct LDL today as she has history of infrequent appointments and is not fasting at this time - f/u one month

## 2020-07-17 ENCOUNTER — Encounter: Payer: Self-pay | Admitting: Family Medicine

## 2020-08-15 ENCOUNTER — Ambulatory Visit (INDEPENDENT_AMBULATORY_CARE_PROVIDER_SITE_OTHER): Payer: Medicare Other | Admitting: Family Medicine

## 2020-08-15 ENCOUNTER — Encounter: Payer: Self-pay | Admitting: Family Medicine

## 2020-08-15 ENCOUNTER — Other Ambulatory Visit: Payer: Self-pay

## 2020-08-15 VITALS — BP 102/60 | HR 88 | Ht 63.0 in | Wt 200.1 lb

## 2020-08-15 DIAGNOSIS — E119 Type 2 diabetes mellitus without complications: Secondary | ICD-10-CM | POA: Diagnosis not present

## 2020-08-15 DIAGNOSIS — I10 Essential (primary) hypertension: Secondary | ICD-10-CM | POA: Diagnosis not present

## 2020-08-15 DIAGNOSIS — E785 Hyperlipidemia, unspecified: Secondary | ICD-10-CM | POA: Diagnosis not present

## 2020-08-15 DIAGNOSIS — R7989 Other specified abnormal findings of blood chemistry: Secondary | ICD-10-CM

## 2020-08-15 LAB — POCT UA - MICROALBUMIN
Creatinine, POC: 50 mg/dL
Microalbumin Ur, POC: 30 mg/L

## 2020-08-15 MED ORDER — CANDESARTAN CILEXETIL 16 MG PO TABS
16.0000 mg | ORAL_TABLET | Freq: Every day | ORAL | 3 refills | Status: DC
Start: 2020-08-15 — End: 2020-08-18

## 2020-08-15 MED ORDER — OZEMPIC (0.25 OR 0.5 MG/DOSE) 2 MG/1.5ML ~~LOC~~ SOPN
0.2500 mg | PEN_INJECTOR | SUBCUTANEOUS | 1 refills | Status: DC
Start: 2020-08-15 — End: 2020-08-15

## 2020-08-15 MED ORDER — TRULICITY 0.75 MG/0.5ML ~~LOC~~ SOAJ
0.7500 mg | SUBCUTANEOUS | 1 refills | Status: DC
Start: 2020-08-15 — End: 2020-08-18

## 2020-08-15 NOTE — Patient Instructions (Addendum)
It was wonderful to see you today. Thank you for allowing me to be a part of your care. Below is a short summary of what we discussed at your visit today:  Diabetes Today we will start you on a medicine called Trulicity. This will help lower your blood sugar levels. The pharmacists will teach you how to use it before you leave today.   Please follow up in one month to check in on how you are doing with this new medication.   Kidney health Today we obtained blood and urine samples to check in on the function of your kidneys. I will call you with the results.     Please bring all of your medications to every appointment!  If you have any questions or concerns, please do not hesitate to contact us via phone or MyChart message.   Fayette Pho, MD

## 2020-08-15 NOTE — Progress Notes (Signed)
Asked by Dr. Larita Fife to educate patient on Trulicity (dulaglutide).   Patient and care provider (daughter) educated on purpose, proper use and potential adverse effects of nausea/GI discomfort.  Following instruction care provider and patient verbalized understanding of treatment plan.

## 2020-08-15 NOTE — Progress Notes (Signed)
     SUBJECTIVE:   CHIEF COMPLAINT / HPI:   Diabetes follow up Patient returns today for a follow up. She has all of her medications with her today. Her daughter and grandchildren are present. The daughter translates, they decline Nurse, learning disability. Performed med rec, current meds are below: - Candesartan and HCTZ 16 mg, 12.5 mg (of note, this medication is obtained from a physician in [place]) - Metformin 1000 mg BID (taking as precribed) - Pravastatin 80 mg (whole tablet) daily at bedtime (notably double the half tablet prescribed) - Jardiance 25 mg once in daily in the morning  Aside from pravastatin, all other meds are taken as prescribed. She is very worried about her kidney health after receiving a letter with her lab results from last visit.   PERTINENT  PMH / PSH: T2DM, HTN, HLD, memory difficulty, chronic bilateral low back pain, chronic right knee pain  OBJECTIVE:   BP 102/60   Pulse 88   Ht 5\' 3"  (1.6 m)   Wt 90.8 kg   SpO2 98%   BMI 35.45 kg/m   PHQ-9:  Depression screen [facility] 2/9 08/15/2020 06/09/2019 05/29/2018  Decreased Interest 0 0 0  Down, Depressed, Hopeless 0 0 0  PHQ - 2 Score 0 0 0  Altered sleeping 0 - -  Tired, decreased energy 2 - -  Change in appetite 1 - -  Feeling bad or failure about yourself  0 - -  Trouble concentrating 0 - -  Moving slowly or fidgety/restless 0 - -  Suicidal thoughts 0 - -  PHQ-9 Score 3 - -     GAD-7: No flowsheet data found.   Physical Exam General: Awake, alert, oriented, no acute distress Respiratory: Unlabored respirations, speaking in full sentences, no respiratory distress Extremities: Moving all extremities spontaneously Neuro: Cranial nerves II through X grossly intact Psych: Normal insight and judgement   ASSESSMENT/PLAN:   DM (diabetes mellitus), type 2 (HCC) A1c 9.7 on 2/11. Current regimen is metformin 1000 mg BID, jardiance 25 mg once daily. Med rec today indicates likely takes meds as prescribed.  - add  Trulicity IM weekly 0.75 mL  - pharmacist Dr. 4/11 and pharm student provided teaching - follow up one month for side effects, etc - recheck A1c in 3 months  Hyperlipidemia Currently prescribed pravastatin 40 mg daily (half of 80 mg tab). Med rec today indicates she is taking the entire 80 mg tab daily for 45 days and then nothing for the next 45 days until she can get her next refill.  - Instructed patient and daughter make sure to take 40 mg (half tab) daily as prescribed  Hypertension Med rec today indicates she is currently taking a candesartan-HCTZ 16-12.5 mg combo pill. The packaging has Arabic and Raymondo Band instructions. We do not prescribe this to her. She reports obtaining this from her old physician in [place]. She was put on a new BP med here but did not like the side effects. She opted to stop the new med and simply switch back to her old HTN med and having it prescribed and mailed from [place].  - BP well controlled today - no dizziness, orthostatic hypotension, syncope - monitor BP at future appointments     [place], MD [facility]

## 2020-08-16 ENCOUNTER — Emergency Department (HOSPITAL_COMMUNITY)
Admission: EM | Admit: 2020-08-16 | Discharge: 2020-08-17 | Disposition: A | Discharge status: Home / Self Care | Payer: Medicaid Other | Attending: Emergency Medicine | Admitting: Emergency Medicine

## 2020-08-16 ENCOUNTER — Other Ambulatory Visit: Payer: Self-pay

## 2020-08-16 DIAGNOSIS — I10 Essential (primary) hypertension: Secondary | ICD-10-CM | POA: Insufficient documentation

## 2020-08-16 DIAGNOSIS — R002 Palpitations: Secondary | ICD-10-CM | POA: Insufficient documentation

## 2020-08-16 DIAGNOSIS — Z79899 Other long term (current) drug therapy: Secondary | ICD-10-CM | POA: Insufficient documentation

## 2020-08-16 DIAGNOSIS — E1165 Type 2 diabetes mellitus with hyperglycemia: Secondary | ICD-10-CM | POA: Insufficient documentation

## 2020-08-16 DIAGNOSIS — Z794 Long term (current) use of insulin: Secondary | ICD-10-CM | POA: Diagnosis not present

## 2020-08-16 DIAGNOSIS — R079 Chest pain, unspecified: Secondary | ICD-10-CM | POA: Insufficient documentation

## 2020-08-16 DIAGNOSIS — Z7984 Long term (current) use of oral hypoglycemic drugs: Secondary | ICD-10-CM | POA: Insufficient documentation

## 2020-08-16 DIAGNOSIS — R739 Hyperglycemia, unspecified: Secondary | ICD-10-CM

## 2020-08-16 LAB — BASIC METABOLIC PANEL
BUN/Creatinine Ratio: 21 (ref 12–28)
BUN: 24 mg/dL (ref 8–27)
CO2: 21 mmol/L (ref 20–29)
Calcium: 9.7 mg/dL (ref 8.7–10.3)
Chloride: 103 mmol/L (ref 96–106)
Creatinine, Ser: 1.13 mg/dL — ABNORMAL HIGH (ref 0.57–1.00)
Glucose: 197 mg/dL — ABNORMAL HIGH (ref 65–99)
Potassium: 4.5 mmol/L (ref 3.5–5.2)
Sodium: 141 mmol/L (ref 134–144)
eGFR: 54 mL/min/{1.73_m2} — ABNORMAL LOW (ref >59)

## 2020-08-16 LAB — PROTEIN / CREATININE RATIO, URINE
Creatinine, Urine: 55.4 mg/dL
Protein, Ur: 10.6 mg/dL
Protein/Creat Ratio: 191 mg/g creat (ref 0–200)

## 2020-08-16 LAB — CBG MONITORING, ED: Glucose-Capillary: 235 mg/dL — ABNORMAL HIGH (ref 70–99)

## 2020-08-16 NOTE — ED Triage Notes (Addendum)
Pt bibems from home. The original call was for CP that was in the center of her chest. Pt sugar was 455. Pt has hx of diabetes. Pt takes metformin. Pt denies n/v

## 2020-08-16 NOTE — ED Provider Notes (Signed)
[facility] EMERGENCY DEPARTMENT Provider Note   CSN: [id number] Arrival date & time: 08/16/20  2337     History Chief Complaint  Patient presents with  . Hyperglycemia  . Chest Pain    Jacqueline Bush is a 65 y.o. female.  The history is provided by the patient and a relative. A language interpreter was used Transport planner - [place] [phone]).  Weakness Severity:  Moderate Onset quality:  Gradual Duration:  2 hours Timing:  Constant Progression:  Worsening Chronicity:  New Relieved by:  Nothing Worsened by:  Nothing Associated symptoms: chest pain   Associated symptoms: no diarrhea, no fever, no headaches, no syncope and no vomiting    Patient is accompanied by her son.  Patient was in her normal state of health when she began having generalized weakness and her heart was racing.  Denies any specific chest pain. Seen by PCP yesterday and felt at her baseline and normal at that time HPI: A 65 year old patient with a history of treated diabetes and hypertension presents for evaluation of chest pain. Initial onset of pain was approximately 1-3 hours ago. The patient's chest pain is described as heaviness/pressure/tightness and is not worse with exertion. The patient's chest pain is middle- or left-sided, is not well-localized, is not sharp and does not radiate to the arms/jaw/neck. The patient does not complain of nausea and denies diaphoresis. The patient has a family history of coronary artery disease in a first-degree relative with onset less than age 33. The patient has no history of stroke, has no history of peripheral artery disease, has not smoked in the past 90 days, has no history of hypercholesterolemia and does not have an elevated BMI (>=30).   Past Medical History:  Diagnosis Date  . Diabetes mellitus   . High cholesterol   . Hypertension     Patient Active Problem List   Diagnosis Date Noted  . Vagina itching 06/10/2019  . Chronic pain of right knee  06/10/2019  . Chronic bilateral low back pain without sciatica 05/29/2018  . Systolic murmur 07/26/3610  . Preventative health care 07/23/2017  . Hypertension 10/13/2011  . DM (diabetes mellitus), type 2 ([place]) 10/13/2011  . Memory difficulty 10/13/2011  . Hyperlipidemia 10/13/2011    No past surgical history on file.   OB History   No obstetric history on file.     Family History  Problem Relation Age of Onset  . Diabetes Mother   . Hypertension Mother   . Hypertension Father   . Heart disease Father   . Hyperlipidemia Father     Social History   Tobacco Use  . Smoking status: Never Smoker  . Smokeless tobacco: Never Used  Substance Use Topics  . Alcohol use: No  . Drug use: No    Home Medications Prior to Admission medications   Medication Sig Start Date End Date Taking? Authorizing Provider  blood glucose meter kit and supplies Dispense based on patient and insurance preference. Use up to four times daily as directed. (FOR ICD-10 E10.9, E11.9).  ANY GENERIC BRAND ACCEPTABLE 09/30/17   Sherene Sires, DO  Blood Pressure Monitoring (WRIST BLOOD PRESSURE MONITOR) MISC 1 Device by Does not apply route once. 10/10/11   Donnal Moat, MD  candesartan (ATACAND) 16 MG tablet Take 1 tablet (16 mg total) by mouth at bedtime. 08/15/20   Ezequiel Essex, MD  Dulaglutide (TRULICITY) 2.44 LP/5.3YY SOPN Inject 0.75 mg into the skin once a week. 08/15/20   Ezequiel Essex, MD  empagliflozin (JARDIANCE) 25 MG TABS tablet Take 1 tablet (25 mg total) by mouth daily. 07/14/20   Ezequiel Essex, MD  metFORMIN (GLUCOPHAGE) 1000 MG tablet TAKE 1 TABLET(1000 MG) BY MOUTH TWICE DAILY WITH A MEAL 07/14/20   Ezequiel Essex, MD  pravastatin (PRAVACHOL) 80 MG tablet Take 0.5 tablets (40 mg total) by mouth daily. 07/14/20   Ezequiel Essex, MD  candesartan-hydrochlorothiazide (ATACAND HCT) 16-12.5 MG per tablet Take 1 tablet by mouth daily. 09/04/11 09/06/11  Melynda Ripple, MD    Allergies     Penicillins  Review of Systems   Review of Systems  Constitutional: Negative for fever.  Cardiovascular: Positive for chest pain. Negative for syncope.  Gastrointestinal: Negative for diarrhea and vomiting.  Neurological: Positive for weakness. Negative for headaches.  All other systems reviewed and are negative.   Physical Exam Updated Vital Signs BP 108/61   Pulse 74   Temp 98.1 F (36.7 C) (Oral)   Resp 19   SpO2 97%   Physical Exam CONSTITUTIONAL: Well developed/well nourished HEAD: Normocephalic/atraumatic EYES: EOMI/PERRL ENMT: Mucous membranes moist NECK: supple no meningeal signs SPINE/BACK:entire spine nontender CV: S1/S2 noted, no murmurs/rubs/gallops noted LUNGS: Lungs are clear to auscultation bilaterally, no apparent distress ABDOMEN: soft, nontender, no rebound or guarding, bowel sounds noted throughout abdomen GU:no cva tenderness NEURO: Pt is awake/alert/appropriate, moves all extremitiesx4.  No facial droop.  No arm or leg drift EXTREMITIES: pulses normal/equalx4, full ROM SKIN: warm, color normal PSYCH: no abnormalities of mood noted, alert and oriented to situation  ED Results / Procedures / Treatments   Labs (all labs ordered are listed, but only abnormal results are displayed) Labs Reviewed  CBC WITH DIFFERENTIAL/PLATELET - Abnormal; Notable for the following components:      Result Value   Hemoglobin 11.4 (*)    HCT 33.5 (*)    All other components within normal limits  COMPREHENSIVE METABOLIC PANEL - Abnormal; Notable for the following components:   CO2 21 (*)    Glucose, Bld 221 (*)    Calcium 8.5 (*)    Total Protein 5.9 (*)    Albumin 3.1 (*)    All other components within normal limits  CBG MONITORING, ED - Abnormal; Notable for the following components:   Glucose-Capillary 235 (*)    All other components within normal limits  LIPASE, BLOOD  TROPONIN I (HIGH SENSITIVITY)  TROPONIN I (HIGH SENSITIVITY)    EKG EKG  Interpretation  Date/Time:  Thursday August 17 2020 00:17:14 EDT Ventricular Rate:  81 PR Interval:    QRS Duration: 90 QT Interval:  401 QTC Calculation: 466 R Axis:   -40 Text Interpretation: Sinus rhythm Inferior infarct, old Consider anterior infarct No previous ECGs available Confirmed by Ripley Fraise ([id number]) on 08/17/2020 12:30:14 AM   Radiology DG Chest Port 1 View  Result Date: 08/17/2020 CLINICAL DATA:  Weakness EXAM: PORTABLE CHEST 1 VIEW COMPARISON:  02/15/2013 FINDINGS: Hazy lung base opacities likely due to overlying soft tissue. No focal airspace disease or effusion. Normal cardiomediastinal silhouette. No pneumothorax. IMPRESSION: No active disease. Electronically Signed   By: Donavan Foil M.D.   On: 08/17/2020 00:16    Procedures Procedures   Medications Ordered in ED Medications - No data to display  ED Course  I have reviewed the triage vital signs and the nursing notes.  Pertinent labs & imaging results that were available during my care of the patient were reviewed by me and considered in my medical decision making (see chart for details).  MDM Rules/Calculators/A&P HEAR Score: 4                        2:36 AM Patient presents with generalized weakness and palpitations.  Patient's glucose was 455 which apparently is the highest she has ever seen. 2:42 AM Patient feeling improved.  She is in no acute distress. She reports she never really had chest pain, mostly palpitations and generalized weakness.  Cardiac monitoring here has been unremarkable. Apparently she just had her diabetic meds switched over but cannot afford them.  Which reported that her glucose has never been this high, which could have contributed to her symptoms.  Patient is requesting discharge home.  Will await repeat troponin and if negative she can be discharged 4:25 AM Repeat troponin is negative.  Patient is at baseline. She is requesting discharge home.  She has hyperglycemia  without anion gap Plan for discharge home.  She will call her PCP later this morning to discuss her diabetic medications. Final Clinical Impression(s) / ED Diagnoses Final diagnoses:  Palpitations  Hyperglycemia    Rx / DC Orders ED Discharge Orders    None       Ripley Fraise, MD 08/17/20 [phone]

## 2020-08-17 ENCOUNTER — Other Ambulatory Visit: Payer: Self-pay | Admitting: Family Medicine

## 2020-08-17 ENCOUNTER — Telehealth: Payer: Self-pay

## 2020-08-17 ENCOUNTER — Emergency Department (HOSPITAL_COMMUNITY): Payer: Medicaid Other

## 2020-08-17 DIAGNOSIS — E119 Type 2 diabetes mellitus without complications: Secondary | ICD-10-CM

## 2020-08-17 LAB — CBC WITH DIFFERENTIAL/PLATELET
Abs Immature Granulocytes: 0.01 10*3/uL (ref 0.00–0.07)
Basophils Absolute: 0 10*3/uL (ref 0.0–0.1)
Basophils Relative: 1 %
Eosinophils Absolute: 0.1 10*3/uL (ref 0.0–0.5)
Eosinophils Relative: 2 %
HCT: 33.5 % — ABNORMAL LOW (ref 36.0–46.0)
Hemoglobin: 11.4 g/dL — ABNORMAL LOW (ref 12.0–15.0)
Immature Granulocytes: 0 %
Lymphocytes Relative: 33 %
Lymphs Abs: 2.1 10*3/uL (ref 0.7–4.0)
MCH: 28.2 pg (ref 26.0–34.0)
MCHC: 34 g/dL (ref 30.0–36.0)
MCV: 82.9 fL (ref 80.0–100.0)
Monocytes Absolute: 0.5 10*3/uL (ref 0.1–1.0)
Monocytes Relative: 8 %
Neutro Abs: 3.5 10*3/uL (ref 1.7–7.7)
Neutrophils Relative %: 56 %
Platelets: 173 10*3/uL (ref 150–400)
RBC: 4.04 MIL/uL (ref 3.87–5.11)
RDW: 12.9 % (ref 11.5–15.5)
WBC: 6.3 10*3/uL (ref 4.0–10.5)
nRBC: 0 % (ref 0.0–0.2)

## 2020-08-17 LAB — COMPREHENSIVE METABOLIC PANEL
ALT: 12 U/L (ref 0–44)
AST: 15 U/L (ref 15–41)
Albumin: 3.1 g/dL — ABNORMAL LOW (ref 3.5–5.0)
Alkaline Phosphatase: 54 U/L (ref 38–126)
Anion gap: 11 (ref 5–15)
BUN: 23 mg/dL (ref 8–23)
CO2: 21 mmol/L — ABNORMAL LOW (ref 22–32)
Calcium: 8.5 mg/dL — ABNORMAL LOW (ref 8.9–10.3)
Chloride: 106 mmol/L (ref 98–111)
Creatinine, Ser: 1 mg/dL (ref 0.44–1.00)
GFR, Estimated: >60 mL/min (ref >60)
Glucose, Bld: 221 mg/dL — ABNORMAL HIGH (ref 70–99)
Potassium: 3.7 mmol/L (ref 3.5–5.1)
Sodium: 138 mmol/L (ref 135–145)
Total Bilirubin: 0.7 mg/dL (ref 0.3–1.2)
Total Protein: 5.9 g/dL — ABNORMAL LOW (ref 6.5–8.1)

## 2020-08-17 LAB — TROPONIN I (HIGH SENSITIVITY)
Troponin I (High Sensitivity): 5 ng/L (ref <18)
Troponin I (High Sensitivity): 5 ng/L (ref <18)

## 2020-08-17 LAB — LIPASE, BLOOD: Lipase: 42 U/L (ref 11–51)

## 2020-08-17 NOTE — Progress Notes (Signed)
Referral to care coordination. Patient prescribed new diabetes medication yesterday 3/16 by PCP (Trulicity, chosen over [place] because Trulicity is on her insurance formulary). Pt went to ED today 3/17, ED provider note indicates she relayed an inability to afford her new medication despite having insurance. This was not communicated to provider 3/16. In fact, on 3/16 she was able to bring all her current meds.   Fayette Pho, MD

## 2020-08-17 NOTE — Discharge Instructions (Addendum)
PLEASE CALL YOUR DOCTOR TODAY ABOUT YOUR DIABETIC MEDS

## 2020-08-17 NOTE — Telephone Encounter (Signed)
Patient's daughter calls nurse line regarding issues with Trulicity rx. Spoke with pharmacist that verified that quantity would need to be changed from 0.5 mL to 2.0 mL. Per pharmacist box cannot be broken and there are 4- 0.5 mL pens per box.   Please advise if quantity can be updated to 2.0 mL.   Veronda Prude, RN

## 2020-08-18 MED ORDER — TRULICITY 0.75 MG/0.5ML ~~LOC~~ SOAJ: 0.7500 mg | SUBCUTANEOUS | 1 refills | Status: DC

## 2020-08-18 MED ORDER — CANDESARTAN CILEXETIL-HCTZ 16-12.5 MG PO TABS
1.0000 | ORAL_TABLET | Freq: Every day | ORAL | 0 refills | Status: DC
Start: 2020-08-18 — End: 2020-11-02

## 2020-08-18 NOTE — Assessment & Plan Note (Signed)
A1c 9.7 on 2/11. Current regimen is metformin 1000 mg BID, jardiance 25 mg once daily. Med rec today indicates likely takes meds as prescribed.  - add Trulicity IM weekly 0.75 mL  - pharmacist Dr. Raymondo Band and pharm student provided teaching - follow up one month for side effects, etc - recheck A1c in 3 months

## 2020-08-18 NOTE — Assessment & Plan Note (Addendum)
Med rec today indicates she is currently taking a candesartan-HCTZ 16-12.5 mg combo pill. The packaging has Arabic and [place] instructions. We do not prescribe this to her. She reports obtaining this from her old physician in [place]. She was put on a new BP med here but did not like the side effects. She opted to stop the new med and simply switch back to her old HTN med and having it prescribed and mailed from [place].  - BP well controlled today - no dizziness, orthostatic hypotension, syncope - monitor BP at future appointments

## 2020-08-18 NOTE — Assessment & Plan Note (Signed)
Currently prescribed pravastatin 40 mg daily (half of 80 mg tab). Med rec today indicates she is taking the entire 80 mg tab daily for 45 days and then nothing for the next 45 days until she can get her next refill.  - Instructed patient and daughter make sure to take 40 mg (half tab) daily as prescribed

## 2020-08-18 NOTE — Telephone Encounter (Signed)
Called pharmacy and gave verbal authorization to change quantity per Dr. Jerolyn Center orders.   Veronda Prude, RN

## 2020-08-23 ENCOUNTER — Telehealth: Payer: Self-pay | Admitting: Family Medicine

## 2020-08-23 ENCOUNTER — Encounter: Payer: Self-pay | Admitting: Family Medicine

## 2020-08-23 DIAGNOSIS — Z114 Encounter for screening for human immunodeficiency virus [HIV]: Secondary | ICD-10-CM

## 2020-08-23 DIAGNOSIS — E785 Hyperlipidemia, unspecified: Secondary | ICD-10-CM

## 2020-08-23 DIAGNOSIS — E119 Type 2 diabetes mellitus without complications: Secondary | ICD-10-CM

## 2020-08-23 DIAGNOSIS — R7989 Other specified abnormal findings of blood chemistry: Secondary | ICD-10-CM

## 2020-08-23 NOTE — Telephone Encounter (Signed)
   Telephone encounter was:  Unsuccessful.  08/23/2020 Name: Jacqueline Bush MRN: [id number] DOB: 05-15-1956  Unsuccessful outbound call made today to assist with:  Financial Difficulties related to medication assistance  Outreach Attempt:  1st Attempt  Attempted to call patient regarding referral, but no answer. Could not leave a voicemail due to no voicemail pick up.   [facility] Care Guide, Embedded Care Coordination [facility], Care Management Phone: [phone] Email: [email or web address]

## 2020-08-23 NOTE — Telephone Encounter (Signed)
Used phone interpreter to call and speak with patient. Son answered the phone and declined interpretation. Explained lab results to the son. 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 Tests will need to be repeated in 3 months (July) for diagnosis. Patient did start Trulicity at last appointment, which is renal protective. Answered all of son's questions.    Fayette Pho, MD

## 2020-08-30 ENCOUNTER — Telehealth: Payer: Self-pay | Admitting: Family Medicine

## 2020-08-30 NOTE — Telephone Encounter (Signed)
   Telephone encounter was:  Unsuccessful.  08/30/2020 Name: Jacqueline Bush MRN: [id number] DOB: 05-Mar-1956  Unsuccessful outbound call made today to assist with:  Medication Assistance  Outreach Attempt:  2nd Attempt  Attempted to call patient's son Mr. Jacqueline Bush to contact patient regarding referral, but he did not answer and could not leave voicemail. Will try to contact patient again within the week.   [facility] Care Guide, Embedded Care Coordination [facility], Care Management Phone: [phone] Email: [email or web address]

## 2020-09-01 ENCOUNTER — Telehealth: Payer: Self-pay | Admitting: Family Medicine

## 2020-09-01 NOTE — Telephone Encounter (Signed)
   Telephone encounter was:  Unsuccessful.  09/01/2020 Name: Jacqueline Bush MRN: [id number] DOB: [birth year]  Unsuccessful outbound call made today to assist with:  Medication Assistance  Outreach Attempt:  3rd Attempt.  Referral closed unable to contact patient.  Attempted to call patient but no answer, and could not leave a voicemail due to phone not accepting calls at this time. Patient will need to reach out to the office if she still needs financial assistance for medications.   [facility] Care Guide, Embedded Care Coordination [facility], Care Management Phone: [phone] Email: [email or web address]

## 2020-09-20 NOTE — Patient Instructions (Addendum)
It was wonderful to see you today. Thank you for allowing me to be a part of your care. Below is a short summary of what we discussed at your visit today:  Diabetes    Keep taking your trulicity as prescribed. We will recheck your A1c in a month. Make a lab appointment to come in and have your A1c checked.   Health maintenance    Colonoscopy: I have referred you to a GI office today for colonoscopy. This GI office will be reaching out to you within a week or two to schedule the necessary appointments.    :          .            .  Mammograms: I will order a mammogram to screen for breast cancer.  You will go to the [facility] imaging center to have this done.  You will call the [facility] imaging center directly at [phone] to schedule an appointment.  A map has been provided and is stapled to this packet.     :       .        .          [phone]  .        [place] Kitchen   Please bring all of your medications to every appointment!         !  If you have any questions or concerns, please do not hesitate to contact us via phone or MyChart message.                   MyChart.  Fayette Pho, MD

## 2020-09-20 NOTE — Progress Notes (Signed)
    SUBJECTIVE:   CHIEF COMPLAINT / HPI:   Diabetes check - current regimen includes Metformin 1000 mg BID, Jardiance 25 mg daily, Trulicity IM weekly 0.75 mL  - Trulicity added at last appointment 3/15 - checking in today for tolerance -  Last A1c 9.7 on 2/11, next due 5/11 - ophtho exam ordered 3/15, not completed yet - will perform foot exam  Health maintenance - today will discuss colonoscopy, mammogram, TDaP, COVID vaccine  PERTINENT  PMH / PSH: T2DM, HTN, HLD, memory difficulty, chronic bilateral low back pain, chronic right knee pain  OBJECTIVE:   BP 110/60   Pulse 71   Ht 5\' 3"  (1.6 m)   Wt 200 lb 3.2 oz (90.8 kg)   SpO2 99%   BMI 35.46 kg/m    PHQ-9:  Depression screen [facility] 2/9 09/21/2020 08/15/2020 06/09/2019  Decreased Interest 1 0 0  Down, Depressed, Hopeless 0 0 0  PHQ - 2 Score 1 0 0  Altered sleeping 0 0 -  Tired, decreased energy 1 2 -  Change in appetite 0 1 -  Feeling bad or failure about yourself  0 0 -  Trouble concentrating 0 0 -  Moving slowly or fidgety/restless 0 0 -  Suicidal thoughts 0 0 -  PHQ-9 Score 2 3 -     GAD-7: No flowsheet data found.   Physical Exam General: Awake, alert, oriented Cardiovascular: Regular rate and rhythm, S1 and S2 present, no murmurs auscultated Respiratory: Lung fields clear to auscultation bilaterally  ASSESSMENT/PLAN:   DM (diabetes mellitus), type 2 (HCC) Tolerating well without side effects. Will increase to 1.5 mg weekly. Next due for A1c check in mid-May.   Need for diphtheria-tetanus-pertussis (Tdap) vaccine Patient declines TDaP vaccine today.   COVID-19 vaccine dose declined Patient declines COVID vaccine today.   Colonoscopy refused Last colonoscopy more than 10 years ago in 08-31-2004 per patient and her daughter. Reports normal at that time. Patient declines referral to GI today for colonoscopy.   Screening for breast cancer Patient accepts offer for mammogram. Order placed. Provided with map to  [facility]. Patient wants to wait to have this done until after the returns from traveling abroad June-August.      09-23-1973, MD [facility] Medicine [facility]

## 2020-09-21 ENCOUNTER — Encounter: Payer: Self-pay | Admitting: Family Medicine

## 2020-09-21 ENCOUNTER — Other Ambulatory Visit: Payer: Self-pay

## 2020-09-21 ENCOUNTER — Ambulatory Visit (INDEPENDENT_AMBULATORY_CARE_PROVIDER_SITE_OTHER): Payer: Medicare Other | Admitting: Family Medicine

## 2020-09-21 VITALS — BP 110/60 | HR 71 | Ht 63.0 in | Wt 200.2 lb

## 2020-09-21 DIAGNOSIS — Z1231 Encounter for screening mammogram for malignant neoplasm of breast: Secondary | ICD-10-CM | POA: Diagnosis not present

## 2020-09-21 DIAGNOSIS — E119 Type 2 diabetes mellitus without complications: Secondary | ICD-10-CM | POA: Diagnosis not present

## 2020-09-21 DIAGNOSIS — Z2821 Immunization not carried out because of patient refusal: Secondary | ICD-10-CM

## 2020-09-21 DIAGNOSIS — Z5320 Procedure and treatment not carried out because of patient's decision for unspecified reasons: Secondary | ICD-10-CM | POA: Diagnosis not present

## 2020-09-21 DIAGNOSIS — E785 Hyperlipidemia, unspecified: Secondary | ICD-10-CM

## 2020-09-21 DIAGNOSIS — Z23 Encounter for immunization: Secondary | ICD-10-CM | POA: Diagnosis not present

## 2020-09-21 MED ORDER — TRULICITY 0.75 MG/0.5ML ~~LOC~~ SOAJ: 0.7500 mg | SUBCUTANEOUS | 3 refills | Status: DC

## 2020-09-24 DIAGNOSIS — Z5320 Procedure and treatment not carried out because of patient's decision for unspecified reasons: Secondary | ICD-10-CM | POA: Insufficient documentation

## 2020-09-24 DIAGNOSIS — Z1239 Encounter for other screening for malignant neoplasm of breast: Secondary | ICD-10-CM | POA: Insufficient documentation

## 2020-09-24 DIAGNOSIS — Z23 Encounter for immunization: Secondary | ICD-10-CM | POA: Insufficient documentation

## 2020-09-24 DIAGNOSIS — Z2821 Immunization not carried out because of patient refusal: Secondary | ICD-10-CM | POA: Insufficient documentation

## 2020-09-24 HISTORY — DX: Procedure and treatment not carried out because of patient's decision for unspecified reasons: Z53.20

## 2020-09-24 HISTORY — DX: Encounter for other screening for malignant neoplasm of breast: Z12.39

## 2020-09-24 MED ORDER — TRULICITY 0.75 MG/0.5ML ~~LOC~~ SOAJ: 1.5000 mg | SUBCUTANEOUS | 3 refills | Status: DC

## 2020-09-24 NOTE — Assessment & Plan Note (Signed)
Last colonoscopy more than 10 years ago in [place] per patient and her daughter. Reports normal at that time. Patient declines referral to GI today for colonoscopy.

## 2020-09-24 NOTE — Assessment & Plan Note (Signed)
Patient accepts offer for mammogram. Order placed. Provided with map to [facility]. Patient wants to wait to have this done until after the returns from traveling abroad June-August.

## 2020-09-24 NOTE — Assessment & Plan Note (Signed)
Patient declines COVID vaccine today.

## 2020-09-24 NOTE — Assessment & Plan Note (Addendum)
Tolerating well without side effects. Will increase to 1.5 mg weekly. Next due for A1c check in mid-May.

## 2020-09-24 NOTE — Assessment & Plan Note (Signed)
Patient declines TDaP vaccine today.

## 2020-10-10 ENCOUNTER — Other Ambulatory Visit: Payer: Self-pay | Admitting: Family Medicine

## 2020-10-10 DIAGNOSIS — E1165 Type 2 diabetes mellitus with hyperglycemia: Secondary | ICD-10-CM

## 2020-10-25 NOTE — Patient Instructions (Addendum)
It was wonderful to see you today. Thank you for allowing me to be a part of your care. Below is a short summary of what we discussed at your visit today:  Diabetes Your A1c today was 7.4%! This is much improved from last time, when it was 9.7%. I am very happy with your results. Please keep taking the Trulicity as prescribed.   I have prescribed enough Trulicity to last until the end of September. You should be able to pick it up at the pharmacy. Call the clinic if you have trouble with this.   Please bring all of your medications to every appointment!  If you have any questions or concerns, please do not hesitate to contact us via phone or MyChart message.   Fayette Pho, MD

## 2020-11-01 ENCOUNTER — Encounter: Payer: Self-pay | Admitting: Family Medicine

## 2020-11-01 ENCOUNTER — Ambulatory Visit (INDEPENDENT_AMBULATORY_CARE_PROVIDER_SITE_OTHER): Payer: Self-pay | Admitting: Family Medicine

## 2020-11-01 ENCOUNTER — Other Ambulatory Visit: Payer: Self-pay

## 2020-11-01 VITALS — BP 115/80 | HR 77 | Ht 63.0 in | Wt 195.8 lb

## 2020-11-01 DIAGNOSIS — E119 Type 2 diabetes mellitus without complications: Secondary | ICD-10-CM

## 2020-11-01 DIAGNOSIS — E1169 Type 2 diabetes mellitus with other specified complication: Secondary | ICD-10-CM

## 2020-11-01 DIAGNOSIS — E785 Hyperlipidemia, unspecified: Secondary | ICD-10-CM

## 2020-11-01 LAB — POCT GLYCOSYLATED HEMOGLOBIN (HGB A1C): HbA1c, POC (controlled diabetic range): 7.4 % — AB (ref 0.0–7.0)

## 2020-11-01 MED ORDER — TRULICITY 0.75 MG/0.5ML ~~LOC~~ SOAJ
1.5000 mg | SUBCUTANEOUS | 1 refills | Status: DC
Start: 2020-11-01 — End: 2020-11-06

## 2020-11-01 NOTE — Progress Notes (Signed)
g

## 2020-11-02 ENCOUNTER — Other Ambulatory Visit: Payer: Self-pay

## 2020-11-02 DIAGNOSIS — E1165 Type 2 diabetes mellitus with hyperglycemia: Secondary | ICD-10-CM

## 2020-11-02 DIAGNOSIS — I10 Essential (primary) hypertension: Secondary | ICD-10-CM

## 2020-11-02 DIAGNOSIS — E119 Type 2 diabetes mellitus without complications: Secondary | ICD-10-CM

## 2020-11-02 DIAGNOSIS — E785 Hyperlipidemia, unspecified: Secondary | ICD-10-CM

## 2020-11-02 MED ORDER — CANDESARTAN CILEXETIL-HCTZ 16-12.5 MG PO TABS: 1.0000 | ORAL_TABLET | Freq: Every day | ORAL | 0 refills | Status: AC

## 2020-11-02 MED ORDER — PRAVASTATIN SODIUM 80 MG PO TABS: 40.0000 mg | ORAL_TABLET | Freq: Every day | ORAL | 0 refills | Status: DC

## 2020-11-02 MED ORDER — EMPAGLIFLOZIN 25 MG PO TABS: 25.0000 mg | ORAL_TABLET | Freq: Every day | ORAL | 0 refills | Status: DC

## 2020-11-04 NOTE — Progress Notes (Signed)
    SUBJECTIVE:   CHIEF COMPLAINT / HPI:   Diabetes recheck - currently on Trulicity weekly - doing well, tolerating well - reports no side effects or local injection site reactions  Upcoming extended travel - will be traveling internationally to visit [place] - expects to be gone 3 months, through September - requests medication refills with enough to take with her while gone for 3 months - expected return Sept 10  PERTINENT  PMH / PSH: HTN, T2DM, memory difficulty, HLD  OBJECTIVE:   BP 115/80   Pulse 77   Ht 5\' 3"  (1.6 m)   Wt 195 lb 12.8 oz (88.8 kg)   SpO2 96%   BMI 34.68 kg/m   PHQ-9:  Depression screen [facility] 2/9 11/01/2020 09/21/2020 08/15/2020  Decreased Interest 1 1 0  Down, Depressed, Hopeless 0 0 0  PHQ - 2 Score 1 1 0  Altered sleeping 0 0 0  Tired, decreased energy 2 1 2   Change in appetite 1 0 1  Feeling bad or failure about yourself  0 0 0  Trouble concentrating 0 0 0  Moving slowly or fidgety/restless 0 0 0  Suicidal thoughts 0 0 0  PHQ-9 Score 4 2 3      GAD-7: No flowsheet data found.   Physical Exam General: Awake, alert, oriented, no acute distress Respiratory: Unlabored respirations, speaking in full sentences, no respiratory distress Extremities: Moving all extremities spontaneously Neuro: Cranial nerves II through X grossly intact Psych: Normal insight and judgement  ASSESSMENT/PLAN:   DM (diabetes mellitus), type 2 (HCC) A1c today 7.4, much improved from 9.7 last visit. Will continue Trulicity, Jardiance, and metformin. Will send in enough script to last her 3 months while traveling abroad.     08/17/2020, MD [facility]

## 2020-11-04 NOTE — Assessment & Plan Note (Addendum)
A1c today 7.4, much improved from 9.7 last visit. Will continue Trulicity, Jardiance, and metformin. Will send in enough script to last her 3 months while traveling abroad.

## 2020-11-06 ENCOUNTER — Telehealth: Payer: Self-pay | Admitting: Family Medicine

## 2020-11-06 ENCOUNTER — Other Ambulatory Visit: Payer: Self-pay | Admitting: Family Medicine

## 2020-11-06 DIAGNOSIS — E785 Hyperlipidemia, unspecified: Secondary | ICD-10-CM

## 2020-11-06 DIAGNOSIS — E119 Type 2 diabetes mellitus without complications: Secondary | ICD-10-CM

## 2020-11-06 DIAGNOSIS — E1165 Type 2 diabetes mellitus with hyperglycemia: Secondary | ICD-10-CM

## 2020-11-06 MED ORDER — TRULICITY 1.5 MG/0.5ML ~~LOC~~ SOAJ: 1.5000 mg | SUBCUTANEOUS | 3 refills | Status: DC

## 2020-11-06 MED ORDER — METFORMIN HCL 1000 MG PO TABS: 1000.0000 mg | ORAL_TABLET | Freq: Two times a day (BID) | ORAL | 3 refills | Status: DC

## 2020-11-06 NOTE — Addendum Note (Signed)
Addended by: Valetta Close on: 11/06/2020 03:54 PM   Modules accepted: Orders

## 2020-11-06 NOTE — Telephone Encounter (Signed)
Patients son returns call to nurse line. Son came and picked up a 2 month supply of Trulicity. Lot: [id number] C Ex: 07/09/2022 I called the pharmacy to see if sending in a 3 month supply of metformin would be pushed through medicaid, however the claim was rejected. Given the time frame we will not be able to push this through medicaid in time for her departure tomorrow. I spoke with son in detail about taking medication list to [place] and to see if she could obtain any during her stay.  Her son was very Adult nurse of all our hard work and very thankful for [employer]

## 2020-11-06 NOTE — Telephone Encounter (Signed)
Patient's son came in stating medicine that was sent to the pharmacy.  Pharmacy is stating can't fill it.  Per son states something wrong with prescription and patient is going out of the [place].  Please call patients son [phone].

## 2020-11-06 NOTE — Telephone Encounter (Signed)
Spoke with PCP and patient is traveling to [place] tomorrow for 3 months. Medicaid will not pay for her 3 month prescriptions in the little time frame that we have. Raymondo Band has agreed to give her our Trulicity supply. I have attempted to call son and inform so he can come by and pick up. I had to LVM.

## 2021-03-08 NOTE — Progress Notes (Signed)
    SUBJECTIVE:   CHIEF COMPLAINT / HPI:   Diabetes -Current regimen includes metformin 1000 mg twice daily, Jardiance 10 mg daily, Trulicity 1.5 mg weekly -A1c today 7.4% -Last A1c 11/01/2020 was 7.4, down from 9.7 on 07/14/2020 -This is the lowest value in the last 3 years, as previously her A1c's ranged from 9.4-10.6 one to three years ago  Hyperlipidemia - Patient currently on pravastatin 40 mg daily - last lipid measurements: LDL = 117 (07/14/20)  Trig = 141 (05/29/18)   Health maintenance - Due for annual flu vaccine, COVID booster, Tdap - Recommend shingles vaccine - Mammogram ordered 09/21/2020 but not completed yet - Patient refuses colonoscopy and Pap smear - Ophthalmology exam ordered 08/23/2020, not completed yet  PERTINENT  PMH / PSH: HTN, T2DM, HLD, chronic bilateral lower back pain, chronic pain of right knee  OBJECTIVE:   BP 110/60   Pulse 72   Ht 5\' 3"  (1.6 m)   Wt 200 lb 2 oz (90.8 kg)   SpO2 98%   BMI 35.45 kg/m    PHQ-9:  Depression screen [facility] 2/9 03/09/2021 11/01/2020 09/21/2020  Decreased Interest 2 1 1   Down, Depressed, Hopeless 1 0 0  PHQ - 2 Score 3 1 1   Altered sleeping 1 0 0  Tired, decreased energy 1 2 1   Change in appetite 1 1 0  Feeling bad or failure about yourself  0 0 0  Trouble concentrating 1 0 0  Moving slowly or fidgety/restless 0 0 0  Suicidal thoughts 0 0 0  PHQ-9 Score 7 4 2   Difficult doing work/chores Not difficult at all - -     GAD-7: No flowsheet data found.   Physical Exam General: Awake, alert, oriented Cardiovascular: Regular rate and rhythm, S1 and S2 present, no murmurs auscultated Respiratory: Lung fields clear to auscultation bilaterally  ASSESSMENT/PLAN:   DM (diabetes mellitus), type 2 (HCC) A1c today 7.4%, stable from four months ago. Current regimen includes metformin 1000 mg twice daily, Jardiance 10 mg daily, Trulicity 1.5 mg weekly. No changes at this time as A1c currently at goal. Next A1c check in 6  months.   Hyperlipidemia Patient declines lipid panel at this time, will do next visit. Plan to continue pravastatin 40 mg daily.   Preventative health care TDaP and Shingrix vaccine scripts sent to pharmacy. Patient declines colonoscopy at this time. Previously referred to ophtho (routine DM eye exam) and mammogram, however not completed yet.      09/23/2020, MD [facility]

## 2021-03-09 ENCOUNTER — Ambulatory Visit (INDEPENDENT_AMBULATORY_CARE_PROVIDER_SITE_OTHER): Payer: Medicaid Other | Admitting: Family Medicine

## 2021-03-09 ENCOUNTER — Other Ambulatory Visit: Payer: Self-pay

## 2021-03-09 ENCOUNTER — Encounter: Payer: Self-pay | Admitting: Family Medicine

## 2021-03-09 VITALS — BP 110/60 | HR 72 | Ht 63.0 in | Wt 200.1 lb

## 2021-03-09 DIAGNOSIS — Z0000 Encounter for general adult medical examination without abnormal findings: Secondary | ICD-10-CM

## 2021-03-09 DIAGNOSIS — I10 Essential (primary) hypertension: Secondary | ICD-10-CM | POA: Diagnosis not present

## 2021-03-09 DIAGNOSIS — E785 Hyperlipidemia, unspecified: Secondary | ICD-10-CM | POA: Diagnosis not present

## 2021-03-09 DIAGNOSIS — Z7985 Long-term (current) use of injectable non-insulin antidiabetic drugs: Secondary | ICD-10-CM | POA: Diagnosis not present

## 2021-03-09 DIAGNOSIS — E1169 Type 2 diabetes mellitus with other specified complication: Secondary | ICD-10-CM

## 2021-03-09 DIAGNOSIS — Z23 Encounter for immunization: Secondary | ICD-10-CM

## 2021-03-09 DIAGNOSIS — E119 Type 2 diabetes mellitus without complications: Secondary | ICD-10-CM

## 2021-03-09 LAB — POCT GLYCOSYLATED HEMOGLOBIN (HGB A1C): HbA1c, POC (controlled diabetic range): 7.4 % — AB (ref 0.0–7.0)

## 2021-03-09 MED ORDER — ZOSTER VAC RECOMB ADJUVANTED 50 MCG/0.5ML IM SUSR: 0.5000 mL | Freq: Once | INTRAMUSCULAR | 0 refills | Status: AC

## 2021-03-09 MED ORDER — TETANUS-DIPHTH-ACELL PERTUSSIS 5-2.5-18.5 LF-MCG/0.5 IM SUSP: 0.5000 mL | Freq: Once | INTRAMUSCULAR | 0 refills | Status: AC

## 2021-03-09 NOTE — Patient Instructions (Addendum)
It was wonderful to meet you today. Thank you for allowing me to be a part of your care. Below is a short summary of what we discussed at your visit today:  Diabetes -Your A1c today 7.4% - This is still within goal!  You are doing a great job - Keep taking your metformin, Jardiance, and Trulicity - We will recheck your A1c in 6 months since you are at goal  Cholesterol -At your next appointment, we will check your cholesterol after you have been taking the medicine  Health maintenance -I have sent a prescription for the Tdap vaccine to your pharmacy.  You will get the vaccine there. - make sure to call the ophthalmologist (eye doctor) for an appointment to check your eyes.  We have every patient with diabetes get your eyes checked regularly to make sure that your blood vessels in the eyes are still normal.  [facility] Address: [address] [place], [place], [place] [address] Phone: [phone]  Please bring all of your medications to every appointment!  If you have any questions or concerns, please do not hesitate to contact us via phone or MyChart message.   Fayette Pho, MD

## 2021-03-11 NOTE — Assessment & Plan Note (Addendum)
TDaP and Shingrix vaccine scripts sent to pharmacy. Patient declines colonoscopy at this time. Previously referred to ophtho (routine DM eye exam) and mammogram, however not completed yet.

## 2021-03-11 NOTE — Assessment & Plan Note (Signed)
A1c today 7.4%, stable from four months ago. Current regimen includes metformin 1000 mg twice daily, Jardiance 10 mg daily, Trulicity 1.5 mg weekly. No changes at this time as A1c currently at goal. Next A1c check in 6 months.

## 2021-03-11 NOTE — Assessment & Plan Note (Addendum)
Patient declines lipid panel at this time, will do next visit. Plan to continue pravastatin 40 mg daily.

## 2021-05-08 ENCOUNTER — Other Ambulatory Visit: Payer: Self-pay

## 2021-05-08 ENCOUNTER — Other Ambulatory Visit: Payer: Medicare Other

## 2021-05-08 DIAGNOSIS — E119 Type 2 diabetes mellitus without complications: Secondary | ICD-10-CM

## 2021-05-08 DIAGNOSIS — E785 Hyperlipidemia, unspecified: Secondary | ICD-10-CM

## 2021-05-08 DIAGNOSIS — R7989 Other specified abnormal findings of blood chemistry: Secondary | ICD-10-CM

## 2021-05-08 DIAGNOSIS — Z114 Encounter for screening for human immunodeficiency virus [HIV]: Secondary | ICD-10-CM

## 2021-05-09 LAB — COMPREHENSIVE METABOLIC PANEL
ALT: 9 IU/L (ref 0–32)
AST: 12 IU/L (ref 0–40)
Albumin/Globulin Ratio: 1.7 (ref 1.2–2.2)
Albumin: 4.3 g/dL (ref 3.8–4.8)
Alkaline Phosphatase: 70 IU/L (ref 44–121)
BUN/Creatinine Ratio: 19 (ref 12–28)
BUN: 22 mg/dL (ref 8–27)
Bilirubin Total: 0.3 mg/dL (ref 0.0–1.2)
CO2: 23 mmol/L (ref 20–29)
Calcium: 9.4 mg/dL (ref 8.7–10.3)
Chloride: 103 mmol/L (ref 96–106)
Creatinine, Ser: 1.13 mg/dL — ABNORMAL HIGH (ref 0.57–1.00)
Globulin, Total: 2.6 g/dL (ref 1.5–4.5)
Glucose: 168 mg/dL — ABNORMAL HIGH (ref 70–99)
Potassium: 4.9 mmol/L (ref 3.5–5.2)
Sodium: 142 mmol/L (ref 134–144)
Total Protein: 6.9 g/dL (ref 6.0–8.5)
eGFR: 54 mL/min/{1.73_m2} — ABNORMAL LOW (ref >59)

## 2021-05-09 LAB — MICROALBUMIN / CREATININE URINE RATIO
Creatinine, Urine: 50.8 mg/dL
Microalb/Creat Ratio: 168 mg/g creat — ABNORMAL HIGH (ref 0–29)
Microalbumin, Urine: 85.1 ug/mL

## 2021-05-09 LAB — HEMOGLOBIN A1C
Est. average glucose Bld gHb Est-mCnc: 177 mg/dL
Hgb A1c MFr Bld: 7.8 % — ABNORMAL HIGH (ref 4.8–5.6)

## 2021-05-09 LAB — HIV ANTIBODY (ROUTINE TESTING W REFLEX): HIV Screen 4th Generation wRfx: NONREACTIVE

## 2021-05-23 ENCOUNTER — Other Ambulatory Visit: Payer: Self-pay

## 2021-05-23 ENCOUNTER — Encounter: Payer: Self-pay | Admitting: Family Medicine

## 2021-05-23 ENCOUNTER — Ambulatory Visit (INDEPENDENT_AMBULATORY_CARE_PROVIDER_SITE_OTHER): Payer: Medicare Other | Admitting: Family Medicine

## 2021-05-23 VITALS — BP 106/60 | HR 87 | Wt 205.2 lb

## 2021-05-23 DIAGNOSIS — E785 Hyperlipidemia, unspecified: Secondary | ICD-10-CM

## 2021-05-23 DIAGNOSIS — E119 Type 2 diabetes mellitus without complications: Secondary | ICD-10-CM

## 2021-05-23 DIAGNOSIS — G8929 Other chronic pain: Secondary | ICD-10-CM | POA: Diagnosis not present

## 2021-05-23 DIAGNOSIS — I10 Essential (primary) hypertension: Secondary | ICD-10-CM | POA: Diagnosis not present

## 2021-05-23 DIAGNOSIS — M25511 Pain in right shoulder: Secondary | ICD-10-CM | POA: Diagnosis present

## 2021-05-23 DIAGNOSIS — M79601 Pain in right arm: Secondary | ICD-10-CM

## 2021-05-23 DIAGNOSIS — Z7985 Long-term (current) use of injectable non-insulin antidiabetic drugs: Secondary | ICD-10-CM | POA: Diagnosis not present

## 2021-05-23 DIAGNOSIS — E1169 Type 2 diabetes mellitus with other specified complication: Secondary | ICD-10-CM

## 2021-05-23 NOTE — Patient Instructions (Signed)
It was wonderful to see you today. Thank you for allowing me to be a part of your care. Below is a short summary of what we discussed at your visit today:  Right shoulder pain I will send you for x-ray of your shoulder. You may walk in at any time they are open. Also, try over the counter medicines for help like tylenol or pain relief patches.   [facility] [address] [place], [place], [place] [address] [phone] Mon-Fri 8-5        Please bring all of your medications to every appointment!  If you have any questions or concerns, please do not hesitate to contact us via phone or MyChart message.   Fayette Pho, MD

## 2021-05-23 NOTE — Progress Notes (Signed)
° ° °  SUBJECTIVE:   CHIEF COMPLAINT / HPI:   Right arm pain - duration over 3 years, worsening - still able to use hand, no issues dropping things - sharp pain - shoulder down to hand, does not include neck - no numbness, tingling  Health Maintenance CBC, lipid panel after new year A1c today  Lab Results  Component Value Date   HGBA1C 7.8 (H) 05/08/2021   HGBA1C 7.4 (A) 03/09/2021   HGBA1C 7.4 (A) 11/01/2020   Lab Results  Component Value Date   MICROALBUR 30 08/15/2020   LDLCALC 139 (H) 05/29/2018   CREATININE 1.13 (H) 05/08/2021    PERTINENT  PMH / PSH:  Patient Active Problem List   Diagnosis Date Noted   Right arm pain 05/26/2021   Need for diphtheria-tetanus-pertussis (Tdap) vaccine 09/24/2020   COVID-19 vaccine dose declined 09/24/2020   Colonoscopy refused 09/24/2020   Screening for breast cancer 09/24/2020   Vagina itching 06/10/2019   Chronic pain of right knee 06/10/2019   Chronic bilateral low back pain without sciatica 05/29/2018   Systolic murmur 07/23/2017   Preventative health care 07/23/2017   Hypertension 10/13/2011   DM (diabetes mellitus), type 2 (HCC) 10/13/2011   Memory difficulty 10/13/2011   Hyperlipidemia 10/13/2011    OBJECTIVE:   BP 106/60    Pulse 87    Wt 205 lb 3.2 oz (93.1 kg)    SpO2 97%    BMI 36.35 kg/m    PHQ-9:  Depression screen [facility] 2/9 05/23/2021 03/09/2021 11/01/2020  Decreased Interest 1 2 1   Down, Depressed, Hopeless 0 1 0  PHQ - 2 Score 1 3 1   Altered sleeping 0 1 0  Tired, decreased energy 2 1 2   Change in appetite 1 1 1   Feeling bad or failure about yourself  0 0 0  Trouble concentrating 0 1 0  Moving slowly or fidgety/restless 0 0 0  Suicidal thoughts 0 0 0  PHQ-9 Score 4 7 4   Difficult doing work/chores - Not difficult at all -     GAD-7: No flowsheet data found.  Physical Exam General: Awake, alert, oriented Extremities: No bilateral lower extremity edema MSK:  Neuro: Cranial nerves II through X  grossly intact, able to move all extremities spontaneously   ASSESSMENT/PLAN:   Right arm pain Chronic, worsening.  Sharp pain from shoulder down to hand.  No numbness, tingling, neurodeficits.  Suspect osteoarthritis.  Patient does not like to take medications, recommend OTC lidocaine patches, heating pad, and range of motion exercises.  Will obtain x-ray.  Return as needed.  DM (diabetes mellitus), type 2 (HCC) Stable, at goal.  A1c 7.8 today.  No changes to management, patient will continue Trulicity 1.5 weekly, Jardiance 25, metformin 1000 twice daily, pravastatin 40 mg daily.     , MD [facility]

## 2021-05-26 DIAGNOSIS — M79601 Pain in right arm: Secondary | ICD-10-CM

## 2021-05-26 HISTORY — DX: Pain in right arm: M79.601

## 2021-05-26 NOTE — Assessment & Plan Note (Signed)
Chronic, worsening.  Sharp pain from shoulder down to hand.  No numbness, tingling, neurodeficits.  Suspect osteoarthritis.  Patient does not like to take medications, recommend OTC lidocaine patches, heating pad, and range of motion exercises.  Will obtain x-ray.  Return as needed.

## 2021-05-26 NOTE — Assessment & Plan Note (Signed)
Stable, at goal.  A1c 7.8 today.  No changes to management, patient will continue Trulicity 1.5 weekly, Jardiance 25, metformin 1000 twice daily, pravastatin 40 mg daily.

## 2021-05-29 ENCOUNTER — Ambulatory Visit
Admission: RE | Admit: 2021-05-29 | Discharge: 2021-05-29 | Disposition: A | Discharge status: Home / Self Care | Payer: Medicare Other | Source: Ambulatory Visit | Attending: Family Medicine | Admitting: Family Medicine

## 2021-05-29 ENCOUNTER — Other Ambulatory Visit: Payer: Self-pay

## 2021-05-29 DIAGNOSIS — G8929 Other chronic pain: Secondary | ICD-10-CM

## 2021-06-01 ENCOUNTER — Telehealth: Payer: Self-pay | Admitting: Family Medicine

## 2021-06-01 NOTE — Telephone Encounter (Signed)
Called Ms. Nish regarding her shoulder x-ray results. Used phone Arabic interpreter Lucama [phone]. No answer, left HIPAA safe VM. 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 Recommend conservatives measures such as OTC pain relievers, heating pads, gentle stretches through the full range of motion.   Fayette Pho, MD

## 2021-06-06 ENCOUNTER — Other Ambulatory Visit: Payer: Self-pay | Admitting: Family Medicine

## 2021-06-06 DIAGNOSIS — E1165 Type 2 diabetes mellitus with hyperglycemia: Secondary | ICD-10-CM

## 2021-06-06 DIAGNOSIS — E119 Type 2 diabetes mellitus without complications: Secondary | ICD-10-CM

## 2021-08-21 ENCOUNTER — Other Ambulatory Visit: Payer: Self-pay | Admitting: Family Medicine

## 2021-08-21 DIAGNOSIS — E119 Type 2 diabetes mellitus without complications: Secondary | ICD-10-CM

## 2021-08-28 ENCOUNTER — Encounter: Payer: Self-pay | Admitting: Family Medicine

## 2021-08-28 ENCOUNTER — Other Ambulatory Visit: Payer: Self-pay

## 2021-08-28 ENCOUNTER — Ambulatory Visit (INDEPENDENT_AMBULATORY_CARE_PROVIDER_SITE_OTHER): Payer: Medicare Other | Admitting: Family Medicine

## 2021-08-28 VITALS — BP 126/62 | HR 80 | Wt 201.6 lb

## 2021-08-28 DIAGNOSIS — I10 Essential (primary) hypertension: Secondary | ICD-10-CM

## 2021-08-28 DIAGNOSIS — E1165 Type 2 diabetes mellitus with hyperglycemia: Secondary | ICD-10-CM | POA: Diagnosis not present

## 2021-08-28 DIAGNOSIS — E785 Hyperlipidemia, unspecified: Secondary | ICD-10-CM | POA: Diagnosis not present

## 2021-08-28 DIAGNOSIS — E119 Type 2 diabetes mellitus without complications: Secondary | ICD-10-CM

## 2021-08-28 LAB — POCT GLYCOSYLATED HEMOGLOBIN (HGB A1C): HbA1c, POC (controlled diabetic range): 7 % (ref 0.0–7.0)

## 2021-08-28 MED ORDER — METFORMIN HCL 1000 MG PO TABS: 1000.0000 mg | ORAL_TABLET | Freq: Two times a day (BID) | ORAL | 0 refills | Status: DC

## 2021-08-28 MED ORDER — EMPAGLIFLOZIN 25 MG PO TABS: 25.0000 mg | ORAL_TABLET | Freq: Every day | ORAL | 0 refills | Status: DC

## 2021-08-28 MED ORDER — TRULICITY 1.5 MG/0.5ML ~~LOC~~ SOAJ: SUBCUTANEOUS | 0 refills | Status: DC

## 2021-08-28 MED ORDER — PRAVASTATIN SODIUM 80 MG PO TABS: 40.0000 mg | ORAL_TABLET | Freq: Every day | ORAL | 0 refills | Status: DC

## 2021-08-28 NOTE — Patient Instructions (Signed)
It was wonderful to see you today. Thank you for allowing me to be a part of your care. Below is a short summary of what we discussed at your visit today: ? ?Diabetes ?Today your A1c is 7.0, improved from 7.8 last time.  This is great news!  Keep up the great work.  I have no changes to your medication at this time.  I have sent refills of your medicine to the pharmacy.  I sent a 97-month supply like we talked about in your visit. ? ?Cholesterol check ?After Ramadan, schedule a lab only appointment to come in and get your cholesterol checked.  Simply call our front desk at [phone] to make a lab only appointment. ? ?Cooking and Nutrition Classes ?[employer] in [place] provides many classes at low or no cost to [employer], nutrition, and agriculture.  Their website offers a huge variety of information related to topics such as gardening, nutrition, cooking, parenting, and health.  Also listed are classes and events, both online and in-person.  Check out their website here: [email or web address]  ? ? ?Please bring all of your medications to every appointment! ? ?If you have any questions or concerns, please do not hesitate to contact us via phone or MyChart message.  ? ?Fayette Pho, MD  ?

## 2021-08-28 NOTE — Progress Notes (Signed)
? ? ?  SUBJECTIVE:  ? ?CHIEF COMPLAINT / HPI:  ? ?T2DM check ?-Current regimen Trulicity 1.5 mg weekly, Jardiance 25 mg daily, metformin 1000 mg twice daily ?-Doing well with medication, no adverse side effects; denies hypoglycemia, diarrhea, GI upset ?- A1c today 7.0, down from 7.8 three months ago ? ?Lab Results  ?Component Value Date  ? HGBA1C 7.0 08/28/2021  ? HGBA1C 7.8 (H) 05/08/2021  ? HGBA1C 7.4 (A) 03/09/2021  ? ?Lab Results  ?Component Value Date  ? MICROALBUR 30 08/15/2020  ? LDLCALC 139 (H) 05/29/2018  ? CREATININE 1.13 (H) 05/08/2021  ? ?HLD ?- Current regimen includes pravastatin 40 mg daily ?- Doing well with medication, no adverse side effects ? ?Lab Results  ?Component Value Date  ? CHOL 226 (H) 05/29/2018  ? HDL 59 05/29/2018  ? LDLCALC 139 (H) 05/29/2018  ? LDLDIRECT 117 (H) 07/14/2020  ? TRIG 141 05/29/2018  ? CHOLHDL 3.8 05/29/2018  ? ?HTN ?- Current regimen candesartan-HCTZ 16-12.5 mg (not managed by [place], obtains overseas when she visits family in [place]) ?-Doing well with medication, no adverse side effects ? ?BP Readings from Last 3 Encounters:  ?08/28/21 126/62  ?05/23/21 106/60  ?03/09/21 110/60  ?  ?PERTINENT  PMH / PSH: HTN, T2DM, HLD, chronic low back pain ? ?OBJECTIVE:  ? ?BP 126/62   Pulse 80   Wt 201 lb 9.6 oz (91.4 kg)   SpO2 98%   BMI 35.71 kg/m?   ? ? ?PHQ-9:  ? ?  08/28/2021  ?  4:01 PM 05/23/2021  ?  3:55 PM 03/09/2021  ?  9:58 AM  ?Depression screen PHQ 2/9  ?Decreased Interest 1 1 2   ?Down, Depressed, Hopeless 1 0 1  ?PHQ - 2 Score 2 1 3   ?Altered sleeping 1 0 1  ?Tired, decreased energy 2 2 1   ?Change in appetite 1 1 1   ?Feeling bad or failure about yourself  0 0 0  ?Trouble concentrating 0 0 1  ?Moving slowly or fidgety/restless 0 0 0  ?Suicidal thoughts 0 0 0  ?PHQ-9 Score 6 4 7   ?Difficult doing work/chores   Not difficult at all  ?  ?Physical Exam ?General: Awake, alert, oriented ?Cardiovascular: Regular rate and rhythm, S1 and S2 present, no murmurs  auscultated ?Respiratory: Lung fields clear to auscultation bilaterally ? ?ASSESSMENT/PLAN:  ? ?DM (diabetes mellitus), type 2 (HCC) ?At goal today!  A1c 7.0.  Tolerating medications well without adverse side effects.  Continue regimen without change: Trulicity 1.5 mg weekly, Jardiance 25 mg daily, metformin 1000 mg twice daily. See AVS for more. Next A1c check in 6-12 months.  ? ?Hypertension ?Stable, at goal.  Tolerating medication without side effects.  No changes at this time. ? ?Hyperlipidemia ?Ordered lipid panel, however patient is currently fasting for Ramadan prefer to wait until after Ramadan is over so she may drink something before she gives blood sample. Will order as future, may collect as lab-only appointment at her convenience. ?  ? ? ? , MD ?[facility]  ?

## 2021-08-28 NOTE — Progress Notes (Deleted)
? ? ?  SUBJECTIVE:  ? ?CHIEF COMPLAINT / HPI:  ? ?*** ?The Pharmacy team is conducting a quality improvement initiative. The recommendation below is for your consideration.  ? ?This patient with Hyperlipidemia and Diabetes may potentially benefit from additional lipid-lowering therapy to achieve LDL goal of < 100 mg/dL and Triglyceride < 150mg /dl, and has an appointment with you on 08/28/2021.  ? ?LDL = 117  ?Trig = Unknown  ?Obtained on 07/14/2020  ? ?Their current pertinent medications include: pravastatin 80 mg  ?   ?If appropriate, please consider the additional therapy recommendations below:  ?(1). Consider new lipid panel   ? ?PERTINENT  PMH / PSH: *** ? ?OBJECTIVE:  ? ?There were no vitals taken for this visit.  ?*** ? ?ASSESSMENT/PLAN:  ? ?No problem-specific Assessment & Plan notes found for this encounter. ?  ? ? ?Ezequiel Essex, MD ?[place]  ?

## 2021-08-29 ENCOUNTER — Other Ambulatory Visit: Payer: Self-pay | Admitting: Family Medicine

## 2021-08-29 DIAGNOSIS — E785 Hyperlipidemia, unspecified: Secondary | ICD-10-CM

## 2021-08-31 ENCOUNTER — Encounter: Payer: Self-pay | Admitting: Family Medicine

## 2021-08-31 NOTE — Assessment & Plan Note (Signed)
At goal today!  A1c 7.0.  Tolerating medications well without adverse side effects.  Continue regimen without change: Trulicity 1.5 mg weekly, Jardiance 25 mg daily, metformin 1000 mg twice daily. See AVS for more. Next A1c check in 6-12 months.  ?

## 2021-08-31 NOTE — Assessment & Plan Note (Signed)
Stable, at goal.  Tolerating medication without side effects.  No changes at this time. ?

## 2021-08-31 NOTE — Assessment & Plan Note (Signed)
Ordered lipid panel, however patient is currently fasting for Ramadan prefer to wait until after Ramadan is over so she may drink something before she gives blood sample. Will order as future, may collect as lab-only appointment at her convenience. ?

## 2021-09-04 ENCOUNTER — Other Ambulatory Visit (HOSPITAL_COMMUNITY): Payer: Self-pay

## 2021-09-06 ENCOUNTER — Other Ambulatory Visit (HOSPITAL_COMMUNITY): Payer: Self-pay

## 2021-09-24 ENCOUNTER — Other Ambulatory Visit: Payer: Self-pay | Admitting: Family Medicine

## 2021-09-24 DIAGNOSIS — E1165 Type 2 diabetes mellitus with hyperglycemia: Secondary | ICD-10-CM

## 2021-09-24 DIAGNOSIS — E119 Type 2 diabetes mellitus without complications: Secondary | ICD-10-CM

## 2021-09-25 ENCOUNTER — Other Ambulatory Visit: Payer: Medicare Other

## 2021-09-25 DIAGNOSIS — I10 Essential (primary) hypertension: Secondary | ICD-10-CM

## 2021-09-25 DIAGNOSIS — E785 Hyperlipidemia, unspecified: Secondary | ICD-10-CM

## 2021-09-25 DIAGNOSIS — E119 Type 2 diabetes mellitus without complications: Secondary | ICD-10-CM

## 2021-09-26 LAB — LIPID PANEL
Chol/HDL Ratio: 3.5 ratio (ref 0.0–4.4)
Cholesterol, Total: 177 mg/dL (ref 100–199)
HDL: 50 mg/dL (ref >39)
LDL Chol Calc (NIH): 80 mg/dL (ref 0–99)
Triglycerides: 290 mg/dL — ABNORMAL HIGH (ref 0–149)
VLDL Cholesterol Cal: 47 mg/dL — ABNORMAL HIGH (ref 5–40)

## 2021-09-26 LAB — CBC
Hematocrit: 36.3 % (ref 34.0–46.6)
Hemoglobin: 12.1 g/dL (ref 11.1–15.9)
MCH: 27.6 pg (ref 26.6–33.0)
MCHC: 33.3 g/dL (ref 31.5–35.7)
MCV: 83 fL (ref 79–97)
Platelets: 176 10*3/uL (ref 150–450)
RBC: 4.38 x10E6/uL (ref 3.77–5.28)
RDW: 13.6 % (ref 11.7–15.4)
WBC: 4.9 10*3/uL (ref 3.4–10.8)

## 2021-09-27 ENCOUNTER — Telehealth: Payer: Self-pay | Admitting: Family Medicine

## 2021-09-27 NOTE — Telephone Encounter (Signed)
Called to discuss blood work results. No answer.  ? ?Hgb normal, no anemia. LDL above goal of <70 given diabetes diagnosis. ASCVD 10 year risk 13.2%. Currently on pravastatin 40 mg.  ? ?Recommend increase to 80 mg. Goal LDL < 70. Recheck direct LDL in ~3 months.  ? ?Ezequiel Essex, MD ?

## 2021-10-08 ENCOUNTER — Ambulatory Visit (INDEPENDENT_AMBULATORY_CARE_PROVIDER_SITE_OTHER): Payer: Medicare Other | Admitting: Family Medicine

## 2021-10-08 ENCOUNTER — Encounter: Payer: Self-pay | Admitting: Family Medicine

## 2021-10-08 DIAGNOSIS — E119 Type 2 diabetes mellitus without complications: Secondary | ICD-10-CM | POA: Diagnosis not present

## 2021-10-08 DIAGNOSIS — E785 Hyperlipidemia, unspecified: Secondary | ICD-10-CM | POA: Diagnosis not present

## 2021-10-08 DIAGNOSIS — E1165 Type 2 diabetes mellitus with hyperglycemia: Secondary | ICD-10-CM

## 2021-10-08 MED ORDER — METFORMIN HCL 1000 MG PO TABS: 1000.0000 mg | ORAL_TABLET | Freq: Two times a day (BID) | ORAL | 3 refills | Status: DC

## 2021-10-08 MED ORDER — TRULICITY 1.5 MG/0.5ML ~~LOC~~ SOAJ: SUBCUTANEOUS | 3 refills | Status: DC

## 2021-10-08 MED ORDER — PRAVASTATIN SODIUM 80 MG PO TABS: 80.0000 mg | ORAL_TABLET | Freq: Every day | ORAL | 3 refills | Status: DC

## 2021-10-08 MED ORDER — EMPAGLIFLOZIN 25 MG PO TABS: ORAL_TABLET | ORAL | 3 refills | Status: DC

## 2021-10-08 NOTE — Patient Instructions (Addendum)
It was wonderful to see you today. Thank you for allowing me to be a part of your care. Below is a short summary of what we discussed at your visit today: ? ?Cholesterol ?I want you to increase your cholesterol medicine, pravastatin. Now take one whole tablet every day. Come back after your trip to have a cholesterol recheck.  ? ?Diabetes ?You have been referred to the eye doctor for an annual diabetic eye exam. Please call them to make an appointment.  ? ?[facility] ?Located in: [facility] ?Address: [address] [place], [place], [place] [address] ?Phone: [phone] ? ?Medication Refills ?I have sent 3 month refills of your medications to your pharmacy. This way, you can have enough medicine while you are visiting overseas.  ? ?We unfortunately do not have any samples of your injection medicine Trulicity.  ? ? ?If you have any questions or concerns, please do not hesitate to contact us via phone or MyChart message.  ? ?Fayette Pho, MD  ?

## 2021-10-08 NOTE — Progress Notes (Signed)
    SUBJECTIVE:   CHIEF COMPLAINT / HPI:   Hyperlipidemia Last fasting LDL 80 on 09/25/2021. Currently on pravastatin 40 mg daily, tolerating well. Given history of T2DM, LDL goal <70. Lab Results  Component Value Date   CHOL 177 09/25/2021   HDL 50 09/25/2021   LDLCALC 80 09/25/2021   LDLDIRECT 117 (H) 07/14/2020   TRIG 290 (H) 09/25/2021   CHOLHDL 3.5 09/25/2021   Medicine refills Patient requests 25-month refills of her Trulicity, Jardiance, metformin, and pravastatin.  She is leaving soon to visit overseas, home country of [place], expects to return early October.  While this is approximately 5 months, last we had difficulty with her insurance covering more than 3 months at a time.  We will write for 78-month supply.  Unfortunately we do not have any samples of her medications here in clinic.  She will buy 38-month supply of all of her medications at the pharmacies in [place], Advertising account planner.  PERTINENT  PMH / PSH:  Patient Active Problem List   Diagnosis Date Noted   Right arm pain 05/26/2021   COVID-19 vaccine dose declined 09/24/2020   Colonoscopy refused 09/24/2020   Chronic pain of right knee 06/10/2019   Chronic bilateral low back pain without sciatica 05/29/2018   Systolic murmur 07/23/2017   Preventative health care 07/23/2017   Hypertension 10/13/2011   DM (diabetes mellitus), type 2 (HCC) 10/13/2011   Memory difficulty 10/13/2011   Hyperlipidemia 10/13/2011    OBJECTIVE:   BP 128/74   Wt 197 lb (89.4 kg)   BMI 34.90 kg/m   PHQ-9:     10/08/2021    3:34 PM 08/28/2021    4:01 PM 05/23/2021    3:55 PM  Depression screen PHQ 2/9  Decreased Interest 1 1 1   Down, Depressed, Hopeless 0 1 0  PHQ - 2 Score 1 2 1   Altered sleeping 0 1 0  Tired, decreased energy 2 2 2   Change in appetite 1 1 1   Feeling bad or failure about yourself  0 0 0  Trouble concentrating 0 0 0  Moving slowly or fidgety/restless 0 0 0  Suicidal thoughts 0 0 0  PHQ-9 Score 4 6 4    Physical  Exam General: Awake, alert, oriented, no acute distress Respiratory: Unlabored respirations, speaking in full sentences, no respiratory distress Extremities: Moving all extremities spontaneously Neuro: Cranial nerves II through X grossly intact Psych: Normal insight and judgement   ASSESSMENT/PLAN:   Hyperlipidemia Fasting LDL 80, above goal of < 70 for primary mention in patient with T2DM.  Will increase pravastatin from 40 mg to 80 mg daily.  80-month supply sent in.  Will recheck LDL when she is returned from in October.  DM (diabetes mellitus), type 2 (HCC) We will send 56-month supplies of Trulicity, Jardiance, and metformin to pharmacy.  Stable and at goal.  No changes at this time.     , MD [facility]

## 2021-10-09 NOTE — Assessment & Plan Note (Signed)
We will send 60-month supplies of Trulicity, Jardiance, and metformin to pharmacy.  Stable and at goal.  No changes at this time. ?

## 2021-10-09 NOTE — Assessment & Plan Note (Addendum)
Fasting LDL 80, above goal of < 70 for primary mention in patient with T2DM.  Will increase pravastatin from 40 mg to 80 mg daily.  68-month supply sent in.  Will recheck LDL when she is returned from [place] in October. ?

## 2022-04-12 ENCOUNTER — Encounter: Payer: Self-pay | Admitting: Family Medicine

## 2022-04-12 ENCOUNTER — Ambulatory Visit (INDEPENDENT_AMBULATORY_CARE_PROVIDER_SITE_OTHER): Payer: Medicare Other | Admitting: Family Medicine

## 2022-04-12 VITALS — BP 132/72 | HR 81 | Ht 63.0 in | Wt 197.4 lb

## 2022-04-12 DIAGNOSIS — E119 Type 2 diabetes mellitus without complications: Secondary | ICD-10-CM

## 2022-04-12 DIAGNOSIS — E785 Hyperlipidemia, unspecified: Secondary | ICD-10-CM

## 2022-04-12 DIAGNOSIS — Z1231 Encounter for screening mammogram for malignant neoplasm of breast: Secondary | ICD-10-CM | POA: Diagnosis not present

## 2022-04-12 DIAGNOSIS — L304 Erythema intertrigo: Secondary | ICD-10-CM

## 2022-04-12 DIAGNOSIS — R051 Acute cough: Secondary | ICD-10-CM | POA: Diagnosis not present

## 2022-04-12 DIAGNOSIS — I10 Essential (primary) hypertension: Secondary | ICD-10-CM

## 2022-04-12 DIAGNOSIS — T7840XS Allergy, unspecified, sequela: Secondary | ICD-10-CM

## 2022-04-12 HISTORY — DX: Erythema intertrigo: L30.4

## 2022-04-12 HISTORY — DX: Acute cough: R05.1

## 2022-04-12 LAB — POCT GLYCOSYLATED HEMOGLOBIN (HGB A1C): HbA1c, POC (controlled diabetic range): 6.5 % (ref 0.0–7.0)

## 2022-04-12 MED ORDER — CETIRIZINE HCL 10 MG PO TABS: 10.0000 mg | ORAL_TABLET | Freq: Every day | ORAL | 1 refills | Status: AC

## 2022-04-12 MED ORDER — BENZONATATE 100 MG PO CAPS: 100.0000 mg | ORAL_CAPSULE | Freq: Two times a day (BID) | ORAL | 0 refills | Status: AC | PRN

## 2022-04-12 MED ORDER — NYSTATIN 100000 UNIT/GM EX POWD: 1.0000 | Freq: Three times a day (TID) | CUTANEOUS | 0 refills | Status: DC

## 2022-04-12 NOTE — Progress Notes (Signed)
    SUBJECTIVE:   CHIEF COMPLAINT / HPI:   Diabetes follow-up Current regimen Trulicity 1.5 mg, Jardiance 25 mg, metformin 1000 mg twice daily Tolerating medications well, no adverse side effects  Lab Results  Component Value Date   HGBA1C 6.5 04/12/2022   HGBA1C 7.0 08/28/2021   HGBA1C 7.8 (H) 05/08/2021   Lab Results  Component Value Date   MICROALBUR 30 08/15/2020   LDLCALC 80 09/25/2021   CREATININE 1.13 (H) 05/08/2021   Intertrigo of the breast Present bilaterally.  Off-and-on chronically.  Currently mildly pruritic.  No streaking or other red flags.  Cough, viral URI Symptoms of 8 or 9 days in duration by today.  Symptoms include productive cough, worse at nighttime, rhinorrhea, congestion, and throat discomfort.  Some reduced appetite, but does not find it painful to swallow. Denies nausea, vomiting, diarrhea, constipation.  No rashes other than intertrigo above.  She presented to urgent care on 11/7 and was diagnosed with viral upper respiratory infection.  Flu and COVID swabs were negative.  She was recommended to take zinc, salt water gargles, Cepacol lozenges, Delsym, NyQuil, and Zyrtec.  Today she reports not much improvement with OTC medications.  Hypertension BP well controlled.  No orthostasis.  Taking candesartan-HCTZ 16-1.5 mg tablets daily. BP Readings from Last 3 Encounters:  04/12/22 132/72  10/08/21 128/74  08/28/21 126/62    Hyperlipidemia Current regimen includes pravastatin 80 mg.  Tolerating well, no adverse side effects. Lab Results  Component Value Date   CHOL 177 09/25/2021   HDL 50 09/25/2021   LDLCALC 80 09/25/2021   LDLDIRECT 117 (H) 07/14/2020   TRIG 290 (H) 09/25/2021   CHOLHDL 3.5 09/25/2021    PERTINENT  PMH / PSH:  Patient Active Problem List   Diagnosis Date Noted   Intertrigo 04/12/2022   Acute cough 04/12/2022   Right arm pain 05/26/2021   COVID-19 vaccine dose declined 09/24/2020   Colonoscopy refused 09/24/2020    Chronic pain of right knee 06/10/2019   Chronic bilateral low back pain without sciatica 05/29/2018   Systolic murmur 07/23/2017   Preventative health care 07/23/2017   Hypertension 10/13/2011   DM (diabetes mellitus), type 2 (HCC) 10/13/2011   Memory difficulty 10/13/2011   Hyperlipidemia 10/13/2011    OBJECTIVE:   BP 132/72   Pulse 81   Ht 5\' 3"  (1.6 m)   Wt 197 lb 6.4 oz (89.5 kg)   SpO2 97%   BMI 34.97 kg/m    General: Awake alert, no acute distress Respiratory: Lungs clear to bases, no wheezes rhonchi or crackles Cardiac: Regular rate and rhythm, no murmurs  ASSESSMENT/PLAN:   DM (diabetes mellitus), type 2 (HCC) Very well controlled, A1c now 6.5.  Continue metformin, Jardiance, and Trulicity.  No changes at this time.  We will collect urine for microalbumin study.  Next A1c 3 to 6 months given she is so well controlled.  Hypertension Well-controlled.  No adverse side effects such as orthostasis.  Continue current regimen candesartan-HCTZ 16-12.5 mg.  Hyperlipidemia We will collect direct LDL today.  Primary prevention goal given diabetes is <70.  Current regimen pravastatin, increased from 40 mg to 80 mg back in May.  Acute cough Acute viral URI, 8 to 9-day duration at this point.  COVID and flu negative at urgent care.  No evidence of secondary pneumonia.  Continue conservative management with Flonase, Zyrtec, Robitussin.  Return precautions given.     June, MD [facility]

## 2022-04-12 NOTE — Assessment & Plan Note (Signed)
Well-controlled.  No adverse side effects such as orthostasis.  Continue current regimen candesartan-HCTZ 16-12.5 mg.

## 2022-04-12 NOTE — Assessment & Plan Note (Signed)
Very well controlled, A1c now 6.5.  Continue metformin, Jardiance, and Trulicity.  No changes at this time.  We will collect urine for microalbumin study.  Next A1c 3 to 6 months given she is so well controlled.

## 2022-04-12 NOTE — Patient Instructions (Addendum)
It was wonderful to see you today. Thank you for allowing me to be a part of your care. Below is a short summary of what we discussed at your visit today:  Flu shot Please make an appointment at our front desk for a nurse only vaccine visit to get your flu shot at your convenience.  Cough I still believe you have a viral upper respiratory infection, commonly called the cold.  I do not hear any evidence of pneumonia, your lungs are clear.  I do not believe you need antibiotics at this time.  Continue using Flonase nasal spray every night.  1 spray into each nostril at bedtime.  Continue using Zyrtec allergy tablets 10 mg every night.  You may use Tessalon Perles as needed for cough suppression.  Be careful, keep this away from children.  Children can overdose quite easily on this medicine.  Come back for care if you develop high fever, worsening shortness of breath or chest pain.  This may be a sign may have developed pneumonia.  Under breast rash Use nystatin powder twice daily until the rash goes away.  Diabetes Your A1c today is 6.5. CONGRATULATIONS! Your A1c back in March was 7.0.   Today we obtained a urine sample to make sure your kidneys are not spilling protein into the urine.   Continue taking your Trulicity, Jardiance, and metformin.  You are due for your next blood work in April 2024.  At that time, we will check in on your kidney function, liver function, blood counts, cholesterol and blood sugar.  Diabetes Eye Exam I have referred you to ophthalmology for your routine diabetic eye exam. We like you get these once a year.  [facility] Located in: [facility] Address: [address] [place], [place], [place] [address] Phone: [phone]  Health Maintenance We like to think about ways to keep you healthy for years to come. Below are some interventions and screenings we can offer to keep you healthy: - Shingles vaccine at your pharmacy - TDaP vaccine - COVID vaccine - flu  vaccine - pneumonia vaccine  Mammogram I have ordered your routine mammogram to screen for breast cancer. This will be at the [facility]. You will call them directly to make an appointment at your convenience. Information below.     Please bring all of your medications to every appointment!  If you have any questions or concerns, please do not hesitate to contact us via phone or MyChart message.   Fayette Pho, MD

## 2022-04-12 NOTE — Assessment & Plan Note (Signed)
We will collect direct LDL today.  Primary prevention goal given diabetes is <70.  Current regimen pravastatin, increased from 40 mg to 80 mg back in May.

## 2022-04-12 NOTE — Assessment & Plan Note (Signed)
Acute viral URI, 8 to 9-day duration at this point.  COVID and flu negative at urgent care.  No evidence of secondary pneumonia.  Continue conservative management with Flonase, Zyrtec, Robitussin.  Return precautions given.

## 2022-04-14 LAB — LDL CHOLESTEROL, DIRECT: LDL Direct: 121 mg/dL — ABNORMAL HIGH (ref 0–99)

## 2022-04-14 LAB — MICROALBUMIN / CREATININE URINE RATIO
Creatinine, Urine: 49.4 mg/dL
Microalb/Creat Ratio: 351 mg/g creat — ABNORMAL HIGH (ref 0–29)
Microalbumin, Urine: 173.4 ug/mL

## 2022-04-15 ENCOUNTER — Encounter: Payer: Self-pay | Admitting: Family Medicine

## 2022-04-15 DIAGNOSIS — E1129 Type 2 diabetes mellitus with other diabetic kidney complication: Secondary | ICD-10-CM | POA: Insufficient documentation

## 2022-04-17 ENCOUNTER — Encounter: Payer: Self-pay | Admitting: Family Medicine

## 2022-04-17 ENCOUNTER — Telehealth: Payer: Self-pay | Admitting: Family Medicine

## 2022-04-17 DIAGNOSIS — E119 Type 2 diabetes mellitus without complications: Secondary | ICD-10-CM

## 2022-04-17 DIAGNOSIS — R809 Proteinuria, unspecified: Secondary | ICD-10-CM

## 2022-04-17 NOTE — Telephone Encounter (Signed)
Attempted to call patient. Normally her daughter accompanies her to visits, but we do not have a contact number for her.   No answer, left VM.   Given increased urine microalbumin, will refer to Nephro.   Would like to change her statin from pravastatin 80 mg to high intensity treatment with rosuvastatin 40 mg.   Fayette Pho, MD   The 10-year ASCVD risk score (Arnett DK, et al., 2019) is: 16%   Values used to calculate the score:     Age: 66 years     Sex: Female     Is Non-Hispanic African American: No     Diabetic: Yes     Tobacco smoker: No     Systolic Blood Pressure: 132 mmHg     Is BP treated: Yes     HDL Cholesterol: 50 mg/dL     Total Cholesterol: 177 mg/dL

## 2022-07-09 ENCOUNTER — Telehealth: Payer: Self-pay | Admitting: Family Medicine

## 2022-07-09 DIAGNOSIS — E119 Type 2 diabetes mellitus without complications: Secondary | ICD-10-CM

## 2022-07-09 MED ORDER — SEMAGLUTIDE(0.25 OR 0.5MG/DOS) 2 MG/3ML ~~LOC~~ SOPN: 0.5000 mg | PEN_INJECTOR | SUBCUTANEOUS | 5 refills | Status: DC

## 2022-07-09 NOTE — Telephone Encounter (Signed)
Patient reports difficulty obtaining Trulicity because it is out of stock.   My system shows that Victoza is not covered under her insurance, but Ozempic and Bydureon are. Called pharmacy and discussed. We looked at published conversion charts to achieve A1c reduction with other medications. Looks like Ozempic 0.5 mg / weekly is in stock now and will get [place] the same A1c reduction as her Trulicity 1.5 mg / weekly.   Will submit script for Ozempic 0.5 mg and call family to discuss.   Ezequiel Essex, MD

## 2022-07-09 NOTE — Telephone Encounter (Signed)
Patient's daughter came in stating that they are having a hard time getting the Trulicity from the pharmacy. They are being told that they are out of stock. She wants to know if there is something else that can be prescribed. Asks if the doctor couldplease call to discuss this, [phone].

## 2022-07-19 ENCOUNTER — Ambulatory Visit (INDEPENDENT_AMBULATORY_CARE_PROVIDER_SITE_OTHER): Payer: Medicare Other | Admitting: Family Medicine

## 2022-07-19 VITALS — BP 136/72 | HR 74 | Ht 63.0 in | Wt 201.4 lb

## 2022-07-19 DIAGNOSIS — L298 Other pruritus: Secondary | ICD-10-CM

## 2022-07-19 DIAGNOSIS — I10 Essential (primary) hypertension: Secondary | ICD-10-CM

## 2022-07-19 DIAGNOSIS — E1129 Type 2 diabetes mellitus with other diabetic kidney complication: Secondary | ICD-10-CM | POA: Diagnosis not present

## 2022-07-19 DIAGNOSIS — R809 Proteinuria, unspecified: Secondary | ICD-10-CM | POA: Diagnosis not present

## 2022-07-19 DIAGNOSIS — E119 Type 2 diabetes mellitus without complications: Secondary | ICD-10-CM

## 2022-07-19 DIAGNOSIS — Z0000 Encounter for general adult medical examination without abnormal findings: Secondary | ICD-10-CM | POA: Diagnosis not present

## 2022-07-19 LAB — POCT GLYCOSYLATED HEMOGLOBIN (HGB A1C): HbA1c, POC (controlled diabetic range): 6.8 % (ref 0.0–7.0)

## 2022-07-19 MED ORDER — PEN NEEDLES 30G X 8 MM MISC: 3 refills | Status: AC

## 2022-07-19 MED ORDER — KETOCONAZOLE 2 % EX SHAM: 1.0000 | MEDICATED_SHAMPOO | CUTANEOUS | 0 refills | Status: DC

## 2022-07-19 NOTE — Patient Instructions (Addendum)
I have translated the following text using Google translate.  As such, there are many errors.  I apologize for the poor written translation; however, we do not have written  translation services yet. It was wonderful to see you today. Thank you for allowing me to be a part of your care. Below is a short summary of what we discussed at your visit today:  Diabetes There is a Consulting civil engineer of Trulicity.  We have to switch you to Ozempic 0.5 mg weekly.  This dose of Ozempic (0.5 mg) is equal to the Trulicity dose (1.2 mg) in terms of blood sugar control.  As soon as we have Trulicity available again, we can switch you back to Trulicity.   Your A1c today is 6.8. Still very good!  Come back in 3 months to check your A1c again while on the Ozempic.   Itchy head Start the ketoconazole.  Use 2-3 times weekly in place of your normal shampoo.  Please let this product sit on your hair for at least 2 to 3 minutes before rinsing off.  Diabetes eye exam We have referred you for a diabetic eye exam. Please call the number below to book a diabetic eye exam.  [facility] Address: [address], [place], [place] [address] Phone: [phone]  Medicare Annual Wellness Exam Your chart indicates that you are due for your Medicare annual wellness exam.  Your insurance likes [place] to do one of these every year.  This is a nurse only visit that takes about 30 minutes to an hour.  It can be done either in person or virtually.  This is an in-depth visit that focuses on preventative care and keeping you healthy.  Somebody from our clinic will be calling you soon to get this scheduled.  Mammogram I have ordered your routine mammogram to screen for breast cancer. This [facility] be at [facility]. You will call them directly to make an appointment at your convenience. Information below.     Please bring all of your medications to every appointment!  If you have any questions or  concerns, please do not hesitate to contact us via phone or MyChart message.   Ezequiel Essex, MD

## 2022-07-19 NOTE — Progress Notes (Signed)
SUBJECTIVE:   CHIEF COMPLAINT / HPI:   Diabetes check Jacqueline Bush presents with her daughter for diabetes check. Her daughter serves as Radio broadcast assistant, they politely decline professional interpretation.   Doing well on regimen of Trulicity 1.5 mg, metformin, and Jardiance. On pravastatin for cholesterol and candesartan for HTN and renal protection.   Recently was told by home pharmacy that there was a Actuary. After discussion with pharmacy, I placed her on equivalent dosing of Ozempic, 0.5 mg weekly, until we were able to get her back on Trulicity.   Today, patient and daughter report that she has the Ozempic pens but have not yet started it because they were unsure of how to use it.   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[place]) 04/15/2022   Intertrigo 04/12/2022   Acute cough 04/12/2022   Right arm pain 05/26/2021   COVID-19 vaccine dose declined 09/24/2020   Colonoscopy refused 09/24/2020   Chronic pain of right knee 06/10/2019   Chronic bilateral low back pain without sciatica [id number]   Systolic murmur [id number]   Preventative health care 07/23/2017   Hypertension 10/13/2011   DM (diabetes mellitus), type 2 ([place]) 10/13/2011   Memory difficulty 10/13/2011   Hyperlipidemia 10/13/2011    OBJECTIVE:   BP 136/72   Pulse 74   Ht '5\' 3"'$  (1.6 m)   Wt 201 lb 6.4 oz (91.4 kg)   SpO2 98%   BMI 35.68 kg/m    PHQ-9:     07/19/2022   10:10 AM 04/12/2022    2:40 PM 10/08/2021    3:34 PM  Depression screen PHQ 2/9  Decreased Interest '2 2 1  '$ Down, Depressed, Hopeless 0 1 0  PHQ - 2  Score '2 3 1  '$ Altered sleeping 0 1 0  Tired, decreased energy '2 2 2  '$ Change in appetite '1 1 1  '$ Feeling bad or failure about yourself  0 1 0  Trouble concentrating 0 1 0  Moving slowly or fidgety/restless 0 1 0  Suicidal thoughts 0 0 0  PHQ-9 Score '5 10 4    '$ General: Awake, alert, oriented Cardiovascular: Regular rate and rhythm, S1 and S2 present, no murmurs auscultated Respiratory: Lung fields clear to auscultation bilaterally Skin: scalp with flaking globally, ovular area of hair loss approx 1"x1.5" with irregular borders over left front scalp near hair line - no erythema or discharge, hair follicles present  ASSESSMENT/PLAN:   Hypertension BP at goal. Still taking the candesartan-HCTZ prescribed by physician in [place].   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   DM (diabetes mellitus), type 2 ([place]) DM at goal with Trulicity, Jardiance, and metformin. Given Producer, television/film/video of Trulicity, prescribed equivalent dose of Ozempic (0.5 mg weekly). Pharmacy resident taught how to use in clinic today. Continue Jardiance, metformin. Tolerating statin well. Will send script for Trulicity to [facility] to see if they are able to get any of that medication in stock sooner than her home pharmacy, as she greatly prefers  Trulicity to [facility]. Next A1c 3 months given med switches.   Pruritic dermatosis of scalp This was an end-of-visit mention, and patient and daughter with limited time to stay for skin scraping. Given pruritus, flaking scalp, and patch of hair loss with follicles that appear to be present and unaffected, will prescribe ketoconazole shampoo 3 times weekly. Could biopsy scalp or refer to derm in future if persists.      Ezequiel Essex, MD [place]

## 2022-07-23 ENCOUNTER — Ambulatory Visit: Payer: Medicare Other

## 2022-07-28 ENCOUNTER — Encounter: Payer: Self-pay | Admitting: Family Medicine

## 2022-07-28 DIAGNOSIS — L298 Other pruritus: Secondary | ICD-10-CM | POA: Insufficient documentation

## 2022-07-28 MED ORDER — TRULICITY 1.5 MG/0.5ML ~~LOC~~ SOAJ
1.5000 mg | SUBCUTANEOUS | 11 refills | Status: DC
  Filled 2022-07-28: qty 2, 28d supply, fill #0

## 2022-07-28 NOTE — Assessment & Plan Note (Signed)
This was an end-of-visit mention, and patient and daughter with limited time to stay for skin scraping. Given pruritus, flaking scalp, and patch of hair loss with follicles that appear to be present and unaffected, will prescribe ketoconazole shampoo 3 times weekly. Could biopsy scalp or refer to derm in future if persists.

## 2022-07-28 NOTE — Assessment & Plan Note (Signed)
BP at goal. Still taking the candesartan-HCTZ prescribed by physician in [place].

## 2022-07-28 NOTE — Assessment & Plan Note (Signed)
DM at goal with Trulicity, Jardiance, and metformin. Given Producer, television/film/video of Trulicity, prescribed equivalent dose of Ozempic (0.5 mg weekly). Pharmacy resident taught how to use in clinic today. Continue Jardiance, metformin. Tolerating statin well. Will send script for Trulicity to [facility] to see if they are able to get any of that medication in stock sooner than her home pharmacy, as she greatly prefers Trulicity to [facility]. Next A1c 3 months given med switches.

## 2022-07-28 NOTE — Assessment & Plan Note (Signed)
Will collect urine microalbumin today to follow.

## 2022-07-29 ENCOUNTER — Other Ambulatory Visit (HOSPITAL_COMMUNITY): Payer: Self-pay

## 2022-07-29 ENCOUNTER — Other Ambulatory Visit: Payer: Self-pay

## 2022-07-29 ENCOUNTER — Encounter (HOSPITAL_BASED_OUTPATIENT_CLINIC_OR_DEPARTMENT_OTHER): Payer: Self-pay | Admitting: Emergency Medicine

## 2022-07-29 ENCOUNTER — Emergency Department (HOSPITAL_BASED_OUTPATIENT_CLINIC_OR_DEPARTMENT_OTHER): Payer: Medicare Other

## 2022-07-29 ENCOUNTER — Other Ambulatory Visit (HOSPITAL_BASED_OUTPATIENT_CLINIC_OR_DEPARTMENT_OTHER): Payer: Self-pay

## 2022-07-29 ENCOUNTER — Emergency Department (HOSPITAL_BASED_OUTPATIENT_CLINIC_OR_DEPARTMENT_OTHER)
Admission: EM | Admit: 2022-07-29 | Discharge: 2022-07-29 | Disposition: A | Discharge status: Home / Self Care | Payer: Medicare Other | Attending: Emergency Medicine | Admitting: Emergency Medicine

## 2022-07-29 DIAGNOSIS — M25511 Pain in right shoulder: Secondary | ICD-10-CM | POA: Insufficient documentation

## 2022-07-29 DIAGNOSIS — E119 Type 2 diabetes mellitus without complications: Secondary | ICD-10-CM | POA: Insufficient documentation

## 2022-07-29 DIAGNOSIS — Z79899 Other long term (current) drug therapy: Secondary | ICD-10-CM | POA: Diagnosis not present

## 2022-07-29 DIAGNOSIS — I10 Essential (primary) hypertension: Secondary | ICD-10-CM | POA: Insufficient documentation

## 2022-07-29 LAB — BASIC METABOLIC PANEL
Anion gap: 9 (ref 5–15)
BUN: 34 mg/dL — ABNORMAL HIGH (ref 8–23)
CO2: 29 mmol/L (ref 22–32)
Calcium: 10 mg/dL (ref 8.9–10.3)
Chloride: 101 mmol/L (ref 98–111)
Creatinine, Ser: 1.23 mg/dL — ABNORMAL HIGH (ref 0.44–1.00)
GFR, Estimated: 48 mL/min — ABNORMAL LOW (ref >60)
Glucose, Bld: 205 mg/dL — ABNORMAL HIGH (ref 70–99)
Potassium: 4.6 mmol/L (ref 3.5–5.1)
Sodium: 139 mmol/L (ref 135–145)

## 2022-07-29 LAB — CBC WITH DIFFERENTIAL/PLATELET
Abs Immature Granulocytes: 0 10*3/uL (ref 0.00–0.07)
Basophils Absolute: 0 10*3/uL (ref 0.0–0.1)
Basophils Relative: 0 %
Eosinophils Absolute: 0.2 10*3/uL (ref 0.0–0.5)
Eosinophils Relative: 3 %
HCT: 35.7 % — ABNORMAL LOW (ref 36.0–46.0)
Hemoglobin: 11.8 g/dL — ABNORMAL LOW (ref 12.0–15.0)
Immature Granulocytes: 0 %
Lymphocytes Relative: 33 %
Lymphs Abs: 1.5 10*3/uL (ref 0.7–4.0)
MCH: 27.6 pg (ref 26.0–34.0)
MCHC: 33.1 g/dL (ref 30.0–36.0)
MCV: 83.4 fL (ref 80.0–100.0)
Monocytes Absolute: 0.3 10*3/uL (ref 0.1–1.0)
Monocytes Relative: 7 %
Neutro Abs: 2.6 10*3/uL (ref 1.7–7.7)
Neutrophils Relative %: 57 %
Platelets: 154 10*3/uL (ref 150–400)
RBC: 4.28 MIL/uL (ref 3.87–5.11)
RDW: 13.5 % (ref 11.5–15.5)
WBC: 4.7 10*3/uL (ref 4.0–10.5)
nRBC: 0 % (ref 0.0–0.2)

## 2022-07-29 LAB — TROPONIN I (HIGH SENSITIVITY): Troponin I (High Sensitivity): 7 ng/L (ref <18)

## 2022-07-29 MED ORDER — ONDANSETRON HCL 8 MG PO TABS
4.0000 mg | ORAL_TABLET | Freq: Four times a day (QID) | ORAL | 0 refills | Status: DC
  Filled 2022-07-29: qty 6, 3d supply, fill #0

## 2022-07-29 MED ORDER — FENTANYL CITRATE PF 50 MCG/ML IJ SOSY
50.0000 ug | PREFILLED_SYRINGE | Freq: Once | INTRAMUSCULAR | Status: AC
  Administered 2022-07-29: 50 ug via INTRAVENOUS
  Filled 2022-07-29: qty 1

## 2022-07-29 MED ORDER — ONDANSETRON HCL 4 MG/2ML IJ SOLN
4.0000 mg | Freq: Once | INTRAMUSCULAR | Status: AC
  Administered 2022-07-29: 4 mg via INTRAVENOUS
  Filled 2022-07-29: qty 2

## 2022-07-29 MED ORDER — OXYCODONE HCL 5 MG PO TABS
5.0000 mg | ORAL_TABLET | Freq: Four times a day (QID) | ORAL | 0 refills | Status: DC | PRN
  Filled 2022-07-29: qty 10, 3d supply, fill #0

## 2022-07-29 NOTE — ED Provider Notes (Signed)
[facility] Provider Note   CSN: [id number] Arrival date & time: 07/29/22  0831     History  Chief Complaint  Patient presents with   Shoulder Pain    Jacqueline Bush is a 67 y.o. female.  Patient here with right-sided shoulder pain.  Pain for the last few days.  History of well-controlled diabetes and hypertension.  Family concerned may be that this is a cardiac process.  She did have some neck discomfort when this started but now she is having pain mostly in her right subscapular area with pain tingling down the right arm.  History of the same.  Denies any trauma.  Does not have any left-sided chest pain or shortness of breath.  No recent surgery or travel.  History of high cholesterol diabetes and hypertension.  The history is provided by the patient.       Home Medications Prior to Admission medications   Medication Sig Start Date End Date Taking? Authorizing Provider  oxyCODONE (ROXICODONE) 5 MG immediate release tablet Take 1 tablet (5 mg total) by mouth every 6 (six) hours as needed for up to 10 doses for breakthrough pain. 07/29/22  Yes Rianne Degraaf, DO  benzonatate (TESSALON) 100 MG capsule Take 1 capsule (100 mg total) by mouth 2 (two) times daily as needed for cough. 04/12/22   Ezequiel Essex, MD  blood glucose meter kit and supplies Dispense based on patient and insurance preference. Use up to four times daily as directed. (FOR ICD-10 E10.9, E11.9).  ANY GENERIC BRAND ACCEPTABLE 09/30/17   Sherene Sires, DO  Blood Pressure Monitoring (WRIST BLOOD PRESSURE MONITOR) MISC 1 Device by Does not apply route once. 10/10/11   Caviness, Haze Boyden, MD  candesartan-hydrochlorothiazide (ATACAND HCT) 16-12.5 MG tablet Take 1 tablet by mouth daily. 11/02/20   Ezequiel Essex, MD  cetirizine (ZYRTEC ALLERGY) 10 MG tablet Take 1 tablet (10 mg total) by mouth daily. 04/12/22   Ezequiel Essex, MD  Dulaglutide (TRULICITY) 1.5 0000000 SOPN Inject 1.5 mg  into the skin once a week. 07/28/22   Ezequiel Essex, MD  empagliflozin (JARDIANCE) 25 MG TABS tablet TAKE 1 TABLET(25 MG) BY MOUTH DAILY 10/08/21   Ezequiel Essex, MD  Insulin Pen Needle (PEN NEEDLES) 30G X 8 MM MISC For use in once weekly Ozempic injection 07/19/22   Ezequiel Essex, MD  ketoconazole (NIZORAL) 2 % shampoo Apply 1 Application topically 2 (two) times a week. Let it set on your head for at least 2-3 minutes before rinsing off. 07/22/22   Ezequiel Essex, MD  metFORMIN (GLUCOPHAGE) 1000 MG tablet Take 1 tablet (1,000 mg total) by mouth 2 (two) times daily with a meal. 10/08/21 10/03/22  Ezequiel Essex, MD  nystatin (MYCOSTATIN/NYSTOP) powder Apply 1 Application topically 3 (three) times daily. 04/12/22   Ezequiel Essex, MD  pravastatin (PRAVACHOL) 80 MG tablet Take 1 tablet (80 mg total) by mouth daily. 10/08/21   Ezequiel Essex, MD  Semaglutide,0.25 or 0.'5MG'$ /DOS, 2 MG/3ML SOPN Inject 0.5 mg into the skin once a week. 07/09/22   Ezequiel Essex, MD      Allergies    Penicillins    Review of Systems   Review of Systems  Physical Exam Updated Vital Signs BP 131/63 (BP Location: Left Arm)   Pulse 70   Temp 97.8 F (36.6 C) (Oral)   Resp 20   Ht '5\' 3"'$  (1.6 m)   Wt 92 kg   SpO2 99%   BMI 35.93 kg/m  Physical  Exam Vitals and nursing note reviewed.  Constitutional:      General: She is not in acute distress.    Appearance: She is well-developed. She is not ill-appearing.  HENT:     Head: Normocephalic and atraumatic.     Nose: Nose normal.     Mouth/Throat:     Mouth: Mucous membranes are moist.  Eyes:     Extraocular Movements: Extraocular movements intact.     Conjunctiva/sclera: Conjunctivae normal.     Pupils: Pupils are equal, round, and reactive to light.  Cardiovascular:     Rate and Rhythm: Normal rate and regular rhythm.     Pulses: Normal pulses.     Heart sounds: Normal heart sounds. No murmur heard. Pulmonary:     Effort: Pulmonary effort is normal. No  respiratory distress.     Breath sounds: Normal breath sounds.  Abdominal:     Palpations: Abdomen is soft.     Tenderness: There is no abdominal tenderness.  Musculoskeletal:        General: Tenderness present.     Cervical back: Normal range of motion and neck supple. No tenderness.     Comments: Appears to have reproducible tenderness to the lateral right subscapular region  Skin:    General: Skin is warm and dry.     Capillary Refill: Capillary refill takes less than 2 seconds.  Neurological:     General: No focal deficit present.     Mental Status: She is alert and oriented to person, place, and time.     Sensory: No sensory deficit.     Motor: No weakness.     ED Results / Procedures / Treatments   Labs (all labs ordered are listed, but only abnormal results are displayed) Labs Reviewed  CBC WITH DIFFERENTIAL/PLATELET - Abnormal; Notable for the following components:      Result Value   Hemoglobin 11.8 (*)    HCT 35.7 (*)    All other components within normal limits  BASIC METABOLIC PANEL - Abnormal; Notable for the following components:   Glucose, Bld 205 (*)    BUN 34 (*)    Creatinine, Ser 1.23 (*)    GFR, Estimated 48 (*)    All other components within normal limits  TROPONIN I (HIGH SENSITIVITY)    EKG EKG Interpretation  Date/Time:  Monday July 29 2022 08:50:28 EST Ventricular Rate:  67 PR Interval:  168 QRS Duration: 79 QT Interval:  397 QTC Calculation: 420 R Axis:   -8 Text Interpretation: Sinus rhythm Low voltage, precordial leads Confirmed by Lennice Sites ([id number]) on 07/29/2022 9:08:30 AM  Radiology DG Chest Portable 1 View  Result Date: 07/29/2022 CLINICAL DATA:  Chest and right shoulder pain for 4 days. EXAM: PORTABLE CHEST 1 VIEW COMPARISON:  Radiographs 08/17/2020 and 02/15/2013. FINDINGS: 0912 hours. Mild patient rotation to the right. The heart size and mediastinal contours are stable. The lungs are clear. There is no pleural effusion or  pneumothorax. No acute osseous findings are identified. Telemetry leads overlie the chest. IMPRESSION: No evidence of acute cardiopulmonary process. Electronically Signed   By: Richardean Sale M.D.   On: 07/29/2022 09:22    Procedures Procedures    Medications Ordered in ED Medications  fentaNYL (SUBLIMAZE) injection 50 mcg (50 mcg Intravenous Given 07/29/22 [id number])    ED Course/ Medical Decision Making/ A&P  Medical Decision Making Amount and/or Complexity of Data Reviewed Labs: ordered. Radiology: ordered.  Risk Prescription drug management.   Chaylin Knaggs is here with right shoulder pain.  Differential diagnosis likely muscular process but will rule out ACS.  EKG shows sinus rhythm.  No ischemic changes per my review and interpretation.  Does not seem to have symptoms to suggest pneumonia or pneumothorax.  Will get CBC, BMP, troponin and chest x-ray.  Comorbidities of hypertension, diabetes and high cholesterol.  Have no concern for PE.  Wells criteria negative.  Neurovascular neuromuscular intact.  She is got normal pulses, normal vitals.  Have no concern for dissection.  Per my review and interpretation the labs is no significant anemia or electrolyte abnormality or kidney injury.  Troponin is normal.  Chest x-ray shows no evidence of pneumonia or pneumothorax per my review and interpretation.  Overall I suspect that this is muscular in nature.  She is not a good candidate for steroids or ibuprofen.  Will write for oxycodone for breakthrough pain.  Recommend lidocaine patches, Tylenol.  Recommend follow-up with primary care doctor.  Discharged in good condition.  This chart was dictated using voice recognition software.  Despite best efforts to proofread,  errors can occur which can change the documentation meaning.         Final Clinical Impression(s) / ED Diagnoses Final diagnoses:  Acute pain of right shoulder    Rx / DC Orders ED Discharge  Orders          Ordered    oxyCODONE (ROXICODONE) 5 MG immediate release tablet  Every 6 hours PRN        07/29/22 [place], [place], DO 07/29/22 [phone]

## 2022-07-29 NOTE — ED Triage Notes (Signed)
Pt arrives to ED with c/o right shoulder and shoulder blade pain x4 days with radiation down her right arm

## 2022-07-29 NOTE — Discharge Instructions (Addendum)
Follow-up with your primary care doctor.  Recommend 1000 mg of Tylenol every 6 hours as needed for pain.  Recommend using lidocaine patches or Voltaren gel for further pain management as well.  I have also prescribed you oxycodone which is a narcotic pain medicine.  This medication is sedating.  Do not mix with alcohol or drugs or dangerous activities including driving.  Follow-up with your primary care doctor for pain management and workup

## 2022-07-29 NOTE — ED Notes (Signed)
Pt given discharge instructions and reviewed prescriptions. Opportunities given for questions. Pt verbalizes understanding. PIV removed x1.Leanne Chang, RN

## 2022-08-01 ENCOUNTER — Ambulatory Visit (INDEPENDENT_AMBULATORY_CARE_PROVIDER_SITE_OTHER): Payer: Medicare Other | Admitting: Family Medicine

## 2022-08-01 DIAGNOSIS — M25511 Pain in right shoulder: Secondary | ICD-10-CM

## 2022-08-01 MED ORDER — KETOROLAC TROMETHAMINE 30 MG/ML IJ SOLN
15.0000 mg | Freq: Once | INTRAMUSCULAR | Status: AC
  Administered 2022-08-01: 15 mg via INTRAMUSCULAR

## 2022-08-01 MED ORDER — KETOROLAC TROMETHAMINE 30 MG/ML IJ SOLN: 15.0000 mg | Freq: Once | INTRAMUSCULAR | Status: DC

## 2022-08-01 MED ORDER — NAPROXEN 500 MG PO TABS: 500.0000 mg | ORAL_TABLET | Freq: Two times a day (BID) | ORAL | 0 refills | Status: AC

## 2022-08-01 NOTE — Progress Notes (Signed)
    SUBJECTIVE:   CHIEF COMPLAINT / HPI:  Chief Complaint  Patient presents with   Shoulder Pain   Hand Pain   Emesis   Dizziness    Here with daughter who is translating.  Patient seen in the ED on 2/26 for right shoulder pain.  Prescribed oxycodone for pain and recommended lidocaine patches and acetaminophen.  Daughter reports she was given an IV medication in the ED which made her nauseous. Oxycodone has been making her nauseous so she stopped taking it yesterday. Patient having severe burning right shoulder pain radiating to the arm for 1 week, crying due to pain sometimes Hard to sleep at night, interfering with daily activities Not worse with movement No known injury, maybe slept on it wrong. Does help husband with bathing, dressing Denies neck pain, rash, numbness/tingling   PERTINENT  PMH / PSH: T2DM, HTN, HLD Most recent right shoulder x-ray reviewed shows some mild osteoarthritis  Patient Care Team: Ezequiel Essex, MD as PCP - General (Family Medicine)   OBJECTIVE:   BP (!) 160/80   Pulse 76   Ht 5' 3"$  (1.6 m)   Wt 199 lb 6.4 oz (90.4 kg)   SpO2 100%   BMI 35.32 kg/m   Physical Exam Constitutional:      General: She is not in acute distress. Cardiovascular:     Rate and Rhythm: Normal rate.  Pulmonary:     Effort: Pulmonary effort is normal. No respiratory distress.  Musculoskeletal:     Cervical back: Neck supple.     Comments: Right shoulder send lower compared to left.  No tenderness to palpation.  Full range of motion without pain with Apley scratch test.  Negative empty can.  Strength is preserved.  2+ radial pulses.  Neurological:     Mental Status: She is alert.         07/19/2022   10:10 AM  Depression screen PHQ 2/9  Decreased Interest 2  Down, Depressed, Hopeless 0  PHQ - 2 Score 2  Altered sleeping 0  Tired, decreased energy 2  Change in appetite 1  Feeling bad or failure about yourself  0  Trouble concentrating 0  Moving slowly  or fidgety/restless 0  Suicidal thoughts 0  PHQ-9 Score 5     {Show previous vital signs (optional):23777}    ASSESSMENT/PLAN:   1. Acute pain of right shoulder Atraumatic.  She seems to be sensitive to opioids so we will discontinue these.  Seems to have significant pain but exam is overall unremarkable.  Do not suspect any muscular or rotator cuff pathology.  Possibly OA given most recent x-ray.  Will treat conservatively with NSAIDs. - naproxen (NAPROSYN) 500 MG tablet; Take 1 tablet (500 mg total) by mouth 2 (two) times daily with a meal.  Dispense: 30 tablet; Refill: 0 - ketorolac (TORADOL) 30 MG/ML injection 15 mg   - consider imaging if not improved at follow-up  Patient turning remaining oxycodone and ondansetron to be disposed of.  Return in about 4 weeks (around 08/29/2022) for f/u R shoulder pain.   Zola Button, MD [place]

## 2022-08-01 NOTE — Patient Instructions (Addendum)
It was nice seeing you today!  Take naproxen twice a day with food for 7 days, then twice a day as needed. You can take Tylenol as needed.  Stay well, Zola Button, MD [place] [phone]  --  Make sure to check out at the front desk before you leave today.  Please arrive at least 15 minutes prior to your scheduled appointments.  If you had blood work today, I will send you a MyChart message or a letter if results are normal. Otherwise, I will give you a call.  If you had a referral placed, they will call you to set up an appointment. Please give [place] a call if you don't hear back in the next 2 weeks.  If you need additional refills before your next appointment, please call your pharmacy first.

## 2022-08-06 ENCOUNTER — Ambulatory Visit (INDEPENDENT_AMBULATORY_CARE_PROVIDER_SITE_OTHER): Payer: Medicare Other | Admitting: Family Medicine

## 2022-08-06 ENCOUNTER — Encounter: Payer: Self-pay | Admitting: Family Medicine

## 2022-08-06 ENCOUNTER — Other Ambulatory Visit (HOSPITAL_COMMUNITY): Payer: Self-pay

## 2022-08-06 VITALS — BP 128/62 | HR 76 | Ht 63.0 in | Wt 201.4 lb

## 2022-08-06 DIAGNOSIS — M79601 Pain in right arm: Secondary | ICD-10-CM

## 2022-08-06 DIAGNOSIS — M5410 Radiculopathy, site unspecified: Secondary | ICD-10-CM

## 2022-08-06 DIAGNOSIS — E119 Type 2 diabetes mellitus without complications: Secondary | ICD-10-CM | POA: Diagnosis not present

## 2022-08-06 DIAGNOSIS — L298 Other pruritus: Secondary | ICD-10-CM | POA: Diagnosis not present

## 2022-08-06 MED ORDER — KETOCONAZOLE 2 % EX SHAM: 1.0000 | MEDICATED_SHAMPOO | CUTANEOUS | 3 refills | Status: AC

## 2022-08-06 MED ORDER — BACLOFEN 20 MG PO TABS: 20.0000 mg | ORAL_TABLET | Freq: Three times a day (TID) | ORAL | 0 refills | Status: DC

## 2022-08-06 MED ORDER — SEMAGLUTIDE(0.25 OR 0.5MG/DOS) 2 MG/3ML ~~LOC~~ SOPN: 0.5000 mg | PEN_INJECTOR | SUBCUTANEOUS | 5 refills | Status: DC

## 2022-08-06 MED ORDER — TRULICITY 1.5 MG/0.5ML ~~LOC~~ SOAJ: 1.5000 mg | SUBCUTANEOUS | 0 refills | Status: DC

## 2022-08-06 NOTE — Patient Instructions (Addendum)
It was wonderful to see you today. Thank you for allowing me to be a part of your care. Below is a short summary of what we discussed at your visit today:  Right arm pain Go for x-rays at [facility] imaging.  X-rays of neck and right shoulder.  Use baclofen for muscle relaxer to see if this helps with the pain.  This may make her sleepy.   I have referred you to sports medicine for further evaluation and treatment.  Someone from their office should be calling you in 1 to 2 weeks to schedule an appointment.  If you do not hear from them, let us know. We may need to nudge along the referral.    Diabetes I have sent in a refill of your Ozempic and Trulicity, which of 1 the pharmacy has in stock.  Please pick up.  Scalp condition I have refilled the shampoo as requested.  Please pick it up at the pharmacy and continue to use, as it has given you improvement.  Please bring all of your medications to every appointment!  If you have any questions or concerns, please do not hesitate to contact us via phone or MyChart message.   Ezequiel Essex, MD

## 2022-08-06 NOTE — Progress Notes (Addendum)
SUBJECTIVE:   CHIEF COMPLAINT / HPI:   Patient presents today with daughter, who translates for her in Arabic.  They politely decline interpreter.  Daughter has interpreted for patient and every appointment I have been to with them.  Diabetes Last A1c 6.8, not yet due for recheck.  Currently well-controlled on Jardiance 25 mg, metformin 1000 mg twice daily, and once weekly GLP-1 injectable.  Previously doing quite well on Trulicity 1.5 mg weekly, however given Optician, dispensing, has been temporarily switched to Ozempic 0.5 mg weekly.  Today, she had daughter present to discuss obtaining extra medication so she can have enough when she travels to [place] for 5 months.  She leaves in May.  Daughter would like me to order 2 extra months now and then 3 extra months when they come in May.  Lab Results  Component Value Date   HGBA1C 6.8 07/19/2022   HGBA1C 6.5 04/12/2022   HGBA1C 7.0 08/28/2021   Lab Results  Component Value Date   MICROALBUR 30 08/15/2020   LDLCALC 80 09/25/2021   CREATININE 1.23 (H) 07/29/2022   Shampoo  Previously evaluated for pruritic dermatosis of scalp at my last visit with her 07/19/2022.  At that time, the complaint as mentioned the end of the visit and patient did not have enough time to stay for the skin scraping.  Prescribed ketoconazole shampoo 3 times weekly.  Today, patient reports that she has noticed great improvement in both the flaking and pruritus of her scalp with occasional close will shampoo.  Daughter notes that the bottle is quite small and they have already set up, they are requesting more.  Shoulder pain The patient has developed acute right shoulder pain since I saw her last.  First evaluated in the emergency room 2/26.  In the ED, ACS ruled out; EKG NSR, troponin negative.  BMP with slightly elevated creatinine. CBC with slight anemia to 11.8, but appears to be near baseline of 12.  Chest x-ray normal.  ED physician prescribed oxycodone  for breakthrough pain, recommended lidocaine patches and Tylenol.  She was evaluated at [facility] 2/29, who recorded the oxycodone made her nauseous so she stopped taking it.  That physician did not believe any muscular rotator cuff pathology, favored OA given most recent x-ray.  Given Toradol injection in clinic and prescribed naproxen 5 mg twice daily.  Recommended 4-week follow-up.  Today, patient reports continued right shoulder pain that wakes her from sleep.  She is still able to lift her arms above her head to do things such as pick up items or do her hair, however it is somewhat painful.  She describes numbness and tingling originating from superior mid shoulder radiating down the right arm.   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[place]) 04/15/2022   Right arm pain 05/26/2021   COVID-19 vaccine dose declined 09/24/2020   Colonoscopy refused 09/24/2020   Chronic pain of right knee 06/10/2019   Chronic bilateral low back pain without sciatica [id number]   Systolic murmur [id number]   Preventative health care 07/23/2017   Hypertension 10/13/2011   DM (diabetes mellitus), type 2 ([place]) 10/13/2011   Memory difficulty 10/13/2011   Hyperlipidemia 10/13/2011    OBJECTIVE:   BP 128/62   Pulse 76   Ht '5\' 3"'$  (1.6 m)   Wt 201 lb 6.4 oz (91.4 kg)  SpO2 97%   BMI 35.68 kg/m    General: Awake, alert, NAD Cardiac: Regular rate and rhythm, no murmurs Respiratory: CTAB MSK: Right shoulder without obvious deformity or swelling, full ROM, moderate TTP over posterior suprascapular region, no other tenderness over clavicle, acromion, humeral head.  Neer's test negative for impingement.  Spurling's test negative bilaterally. Neuro: Grip strength 5/5 and equal bilaterally, BUE push and pull 5/5 and equal bilaterally, sensation intact bilaterally  ASSESSMENT/PLAN:    Pruritic dermatosis of scalp Interval improvement with ketoconazole shampoo, however they have ran out because swallow small.  Will prescribe greater volume.  To be used 3 times weekly left on for at least 3 minutes each application.  DM (diabetes mellitus), type 2 ([place]) [place].  Daughter requests 253-monthupply of her injectable GLP-1 today and then 3-53-monthpply when they return in May.  Previously doing quite well on Trulicity 1.5 mg weekly, however given natOptician, dispensingas been temporarily switched to Ozempic 0.5 mg weekly.  Patient [place] has been more comfortable doing these [place] herself [place] prescription for both Ozempic and Trulicity to her pharmacy, so she may obtain a 2-m61-monthply of what ever is available currently.  Right arm pain Acute arm pain with associated numbness and tingling continues.  Given acute nontraumatic onset of this episode now requiring care in the ED multiple visits [facility] treat a little more aggressively.  Will obtain XR C-spine and right shoulder and refer to sports medicine.  Patient to continue using OTC lidocaine patches and Tylenol.  Rx baclofen trial.  Would steer away from NSAIDs given most recent creatinine measurements.  No red flags to necessitate emergent care.  Will continue a stepwise approach.     CathEzequiel Essex [place]

## 2022-08-08 NOTE — Assessment & Plan Note (Signed)
[place].  Daughter requests 210-monthupply of her injectable GLP-1 today and then 3-75-monthpply when they return in May.  Previously doing quite well on Trulicity 1.5 mg weekly, however given natOptician, dispensingas been temporarily switched to Ozempic 0.5 mg weekly.  Patient prefers [place] has been more comfortable doing these by herself [place] a prescription for both Ozempic and Trulicity to her pharmacy, so she may obtain a 2-m1-monthply of what ever is available currently.

## 2022-08-08 NOTE — Assessment & Plan Note (Signed)
Interval improvement with ketoconazole shampoo, however they have ran out because swallow small.  Will prescribe greater volume.  To be used 3 times weekly left on for at least 3 minutes each application.

## 2022-08-08 NOTE — Assessment & Plan Note (Addendum)
Acute arm pain with associated numbness and tingling continues.  Given acute nontraumatic onset of this episode now requiring care in the ED multiple visits to [facility], will treat a little more aggressively.  Will obtain XR C-spine and right shoulder and refer to sports medicine.  Patient to continue using OTC lidocaine patches and Tylenol.  Rx baclofen trial.  Would steer away from NSAIDs given most recent creatinine measurements.  No red flags to necessitate emergent care.  Will continue a stepwise approach.

## 2022-08-13 ENCOUNTER — Ambulatory Visit
Admission: RE | Admit: 2022-08-13 | Discharge: 2022-08-13 | Disposition: A | Discharge status: Home / Self Care | Payer: Medicare Other | Source: Ambulatory Visit | Attending: Family Medicine | Admitting: Family Medicine

## 2022-08-13 DIAGNOSIS — M5410 Radiculopathy, site unspecified: Secondary | ICD-10-CM

## 2022-08-14 ENCOUNTER — Encounter: Payer: Self-pay | Admitting: Family Medicine

## 2022-08-14 ENCOUNTER — Telehealth: Payer: Self-pay | Admitting: Family Medicine

## 2022-08-14 NOTE — Telephone Encounter (Signed)
Called to discuss results. Spoke with daughter.  Shoulder x-ray showing degenerative changes but no acute processes.  Recommend they follow-up with sports medicine.  Referral has been placed, however there is no follow-up appointment scheduled at this time.  Encouraged them to call.  [place] Address: [address], [place], [place] [address] Phone: [phone]  Ezequiel Essex, MD

## 2022-10-04 ENCOUNTER — Other Ambulatory Visit (HOSPITAL_COMMUNITY): Payer: Self-pay

## 2022-10-28 IMAGING — DX DG CHEST 1V PORT
1 series · 1 of 1 positions shown · non-contrast
Comparison: 02/15/2013

CLINICAL DATA: Weakness

EXAM:
PORTABLE CHEST 1 VIEW

[chest]
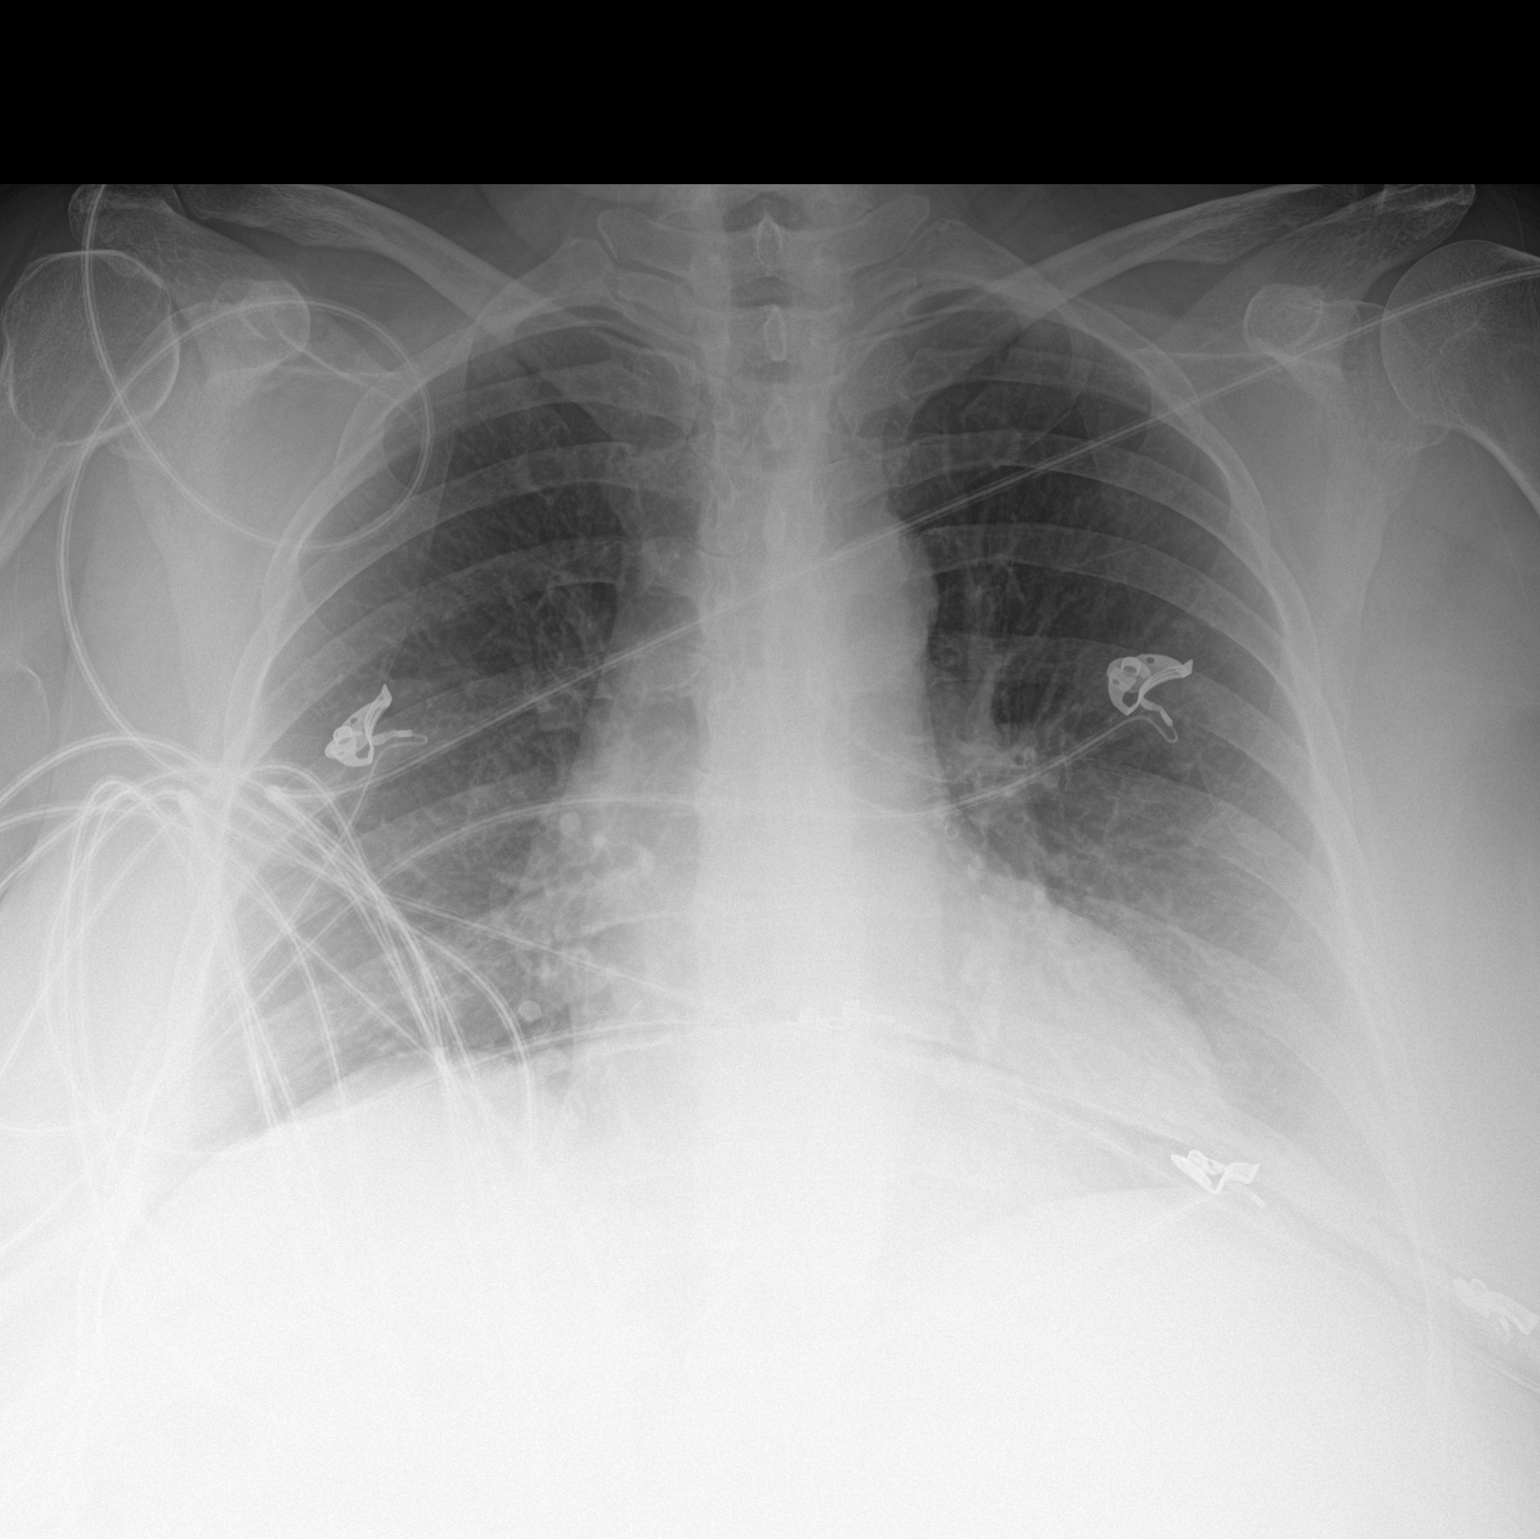

[1 of 1 positions shown; findings below may reference images not displayed]

FINDINGS: Hazy lung base opacities likely due to overlying soft tissue. No
focal airspace disease or effusion. Normal cardiomediastinal
silhouette. No pneumothorax.
IMPRESSION: No active disease.

## 2022-11-07 ENCOUNTER — Encounter: Payer: Self-pay | Admitting: Family Medicine

## 2022-11-07 ENCOUNTER — Other Ambulatory Visit: Payer: Self-pay

## 2022-11-07 ENCOUNTER — Other Ambulatory Visit (HOSPITAL_COMMUNITY): Payer: Self-pay

## 2022-11-07 ENCOUNTER — Ambulatory Visit (INDEPENDENT_AMBULATORY_CARE_PROVIDER_SITE_OTHER): Payer: Medicare Other | Admitting: Family Medicine

## 2022-11-07 VITALS — BP 125/65 | HR 88 | Ht 63.0 in | Wt 193.0 lb

## 2022-11-07 DIAGNOSIS — E119 Type 2 diabetes mellitus without complications: Secondary | ICD-10-CM

## 2022-11-07 DIAGNOSIS — E785 Hyperlipidemia, unspecified: Secondary | ICD-10-CM | POA: Diagnosis not present

## 2022-11-07 DIAGNOSIS — L298 Other pruritus: Secondary | ICD-10-CM | POA: Diagnosis not present

## 2022-11-07 DIAGNOSIS — I10 Essential (primary) hypertension: Secondary | ICD-10-CM | POA: Diagnosis not present

## 2022-11-07 DIAGNOSIS — E1165 Type 2 diabetes mellitus with hyperglycemia: Secondary | ICD-10-CM | POA: Diagnosis not present

## 2022-11-07 DIAGNOSIS — L304 Erythema intertrigo: Secondary | ICD-10-CM

## 2022-11-07 LAB — POCT GLYCOSYLATED HEMOGLOBIN (HGB A1C): HbA1c, POC (controlled diabetic range): 7.4 % — AB (ref 0.0–7.0)

## 2022-11-07 MED ORDER — FLUOCINOLONE ACETONIDE 0.01 % EX SHAM
MEDICATED_SHAMPOO | CUTANEOUS | 3 refills | Status: DC
  Filled 2022-11-07: qty 360, fill #0

## 2022-11-07 MED ORDER — PRAVASTATIN SODIUM 80 MG PO TABS
80.0000 mg | ORAL_TABLET | Freq: Every day | ORAL | 3 refills | Status: DC
  Filled 2022-11-07: qty 90, 90d supply, fill #0

## 2022-11-07 MED ORDER — BACLOFEN 20 MG PO TABS
20.0000 mg | ORAL_TABLET | Freq: Three times a day (TID) | ORAL | 0 refills | Status: DC
  Filled 2022-11-07: qty 90, 30d supply, fill #0

## 2022-11-07 MED ORDER — FLUOCINOLONE ACETONIDE 0.01 % EX SHAM
MEDICATED_SHAMPOO | CUTANEOUS | 3 refills | Status: AC
  Filled 2022-11-07: qty 360, 28d supply, fill #0

## 2022-11-07 MED ORDER — TRULICITY 1.5 MG/0.5ML ~~LOC~~ SOAJ
1.5000 mg | SUBCUTANEOUS | 2 refills | Status: DC
  Filled 2022-11-07: qty 6, 84d supply, fill #0

## 2022-11-07 MED ORDER — NYSTATIN 100000 UNIT/GM EX POWD
1.0000 | Freq: Three times a day (TID) | CUTANEOUS | 1 refills | Status: AC
  Filled 2022-11-07: qty 30, 15d supply, fill #0

## 2022-11-07 MED ORDER — NYSTATIN 100000 UNIT/GM EX POWD
1.0000 | Freq: Three times a day (TID) | CUTANEOUS | 1 refills | Status: DC
  Filled 2022-11-07: qty 120, 30d supply, fill #0

## 2022-11-07 MED ORDER — TRULICITY 1.5 MG/0.5ML ~~LOC~~ SOAJ
1.5000 mg | SUBCUTANEOUS | 2 refills | Status: DC
  Filled 2022-11-07: qty 2, 28d supply, fill #0

## 2022-11-07 MED ORDER — EMPAGLIFLOZIN 25 MG PO TABS
25.0000 mg | ORAL_TABLET | Freq: Every day | ORAL | 3 refills | Status: DC
  Filled 2022-11-07: qty 90, 90d supply, fill #0

## 2022-11-07 MED ORDER — METFORMIN HCL 1000 MG PO TABS
1000.0000 mg | ORAL_TABLET | Freq: Two times a day (BID) | ORAL | 3 refills | Status: DC
  Filled 2022-11-07: qty 180, 90d supply, fill #0

## 2022-11-07 MED ORDER — PRAVASTATIN SODIUM 80 MG PO TABS
80.0000 mg | ORAL_TABLET | Freq: Every day | ORAL | 3 refills | Status: DC
  Filled 2022-11-07: qty 90, 90d supply, fill #0
  Filled 2023-05-30: qty 90, 90d supply, fill #1
  Filled 2023-09-05: qty 90, 90d supply, fill #2

## 2022-11-07 MED ORDER — METFORMIN HCL 1000 MG PO TABS
1000.0000 mg | ORAL_TABLET | Freq: Two times a day (BID) | ORAL | 3 refills | Status: DC
  Filled 2022-11-07: qty 180, 90d supply, fill #0
  Filled 2023-02-27: qty 180, 90d supply, fill #1
  Filled 2023-05-30: qty 180, 90d supply, fill #2
  Filled 2023-09-05: qty 180, 90d supply, fill #3

## 2022-11-07 MED ORDER — BACLOFEN 20 MG PO TABS
20.0000 mg | ORAL_TABLET | Freq: Three times a day (TID) | ORAL | 0 refills | Status: AC
  Filled 2022-11-07: qty 90, 30d supply, fill #0

## 2022-11-07 NOTE — Patient Instructions (Signed)
It was wonderful to see you today. Thank you for allowing me to be a part of your care. Below is a short summary of what we discussed at your visit today:  Diabetes A1c was 7.4 today, up from 6.8. Still not too bad and at our goal for under 8.  I have refilled the Trulicity so you have enough for your travels.  Pleas pick up from [facility] pharmacy.   International travel  Today you received the the Tdap vaccine.  You may experience some residual soreness at the injection site.  Gentle stretches and regular use of that arm will help speed up your recovery.  As the vaccines are giving your immune system a "practice run" against specific infections, you may feel a little under the weather for the next several days.  We recommend rest as needed and hydrating.  I have written a letter for you to take your Trulicity medication through the airport.   Shampoo We will switch to the fluocinolone shampoo. Use the shampoo 2-3 times weekly. Leave on scalp for 3-5 minutes before rinsing.   Please bring all of your medications to every appointment!  If you have any questions or concerns, please do not hesitate to contact us via phone or MyChart message.   Fayette Pho, MD

## 2022-11-07 NOTE — Progress Notes (Signed)
SUBJECTIVE:   CHIEF COMPLAINT / HPI:   Ms. Jacqueline Bush is a pleasant 67 yo woman here with her daughter and two grandchildren. Daughter serves as Clinical research associate. They politely decline interpreter.   T2DM Previously temporarily switched from Trulicity 1.5 mg weekly to [employer] given Industrial/product designer.  Today, daughter reports that she is not a fan of the Ozempic pen and finds it difficult.  She has help from family, however they also forget to administer medication if the patient does not remind them.  Daughter believes it has been about a month or so without the Ozempic.  Has continued on her other diabetes medications.  Would like a 3 month supply of Trulicity for her upcoming trip to [place].   Lab Results  Component Value Date   HGBA1C 7.4 (A) 11/07/2022   HGBA1C 6.8 07/19/2022   HGBA1C 6.5 04/12/2022   Lab Results  Component Value Date   MICROALBUR 30 08/15/2020   LDLCALC 80 09/25/2021   CREATININE 1.23 (H) 07/29/2022    Scalp dandruff and pruritus Previously declined skin scrapings at prior appointments.  We had been using ketokonazole shampoo with good success, but patient reports it is no longer effective.  Is using shampoo 2-3 times per week.   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HCC) 04/15/2022   Intertrigo 04/12/2022   COVID-19 vaccine dose declined 09/24/2020   Chronic pain of right knee 06/10/2019   Chronic bilateral low back pain without sciatica 05/29/2018   Systolic murmur 07/23/2017   Preventative health care 07/23/2017   Hypertension 10/13/2011   DM (diabetes mellitus), type 2 (HCC) 10/13/2011   Memory difficulty 10/13/2011   Hyperlipidemia 10/13/2011    OBJECTIVE:   BP 125/65   Pulse 88   Ht 5\' 3"  (1.6 m)   Wt 193 lb (87.5 kg)   SpO2 97%   BMI 34.19 kg/m    PHQ-9:     11/07/2022    4:35 PM 08/06/2022   11:44 AM  07/19/2022   10:10 AM  Depression screen PHQ 2/9  Decreased Interest 1 1 2   Down, Depressed, Hopeless 0 0 0  PHQ - 2 Score 1 1 2   Altered sleeping 0 0 0  Tired, decreased energy 1 1 2   Change in appetite 0 1 1  Feeling bad or failure about yourself  0 0 0  Trouble concentrating 0 0 0  Moving slowly or fidgety/restless 0 0 0  Suicidal thoughts 0 0 0  PHQ-9 Score 2 3 5     Physical Exam General: Awake, alert, oriented Cardiovascular: Regular rate and rhythm, S1 and S2 present, no murmurs auscultated Respiratory: Lung fields clear to auscultation bilaterally Skin: Hair with sparse flakes, scalp without erythema, lesions, or other abnormalities  ASSESSMENT/PLAN:   Pruritic dermatosis of scalp Given ketoconazole has become ineffective and we never did a skin scraping to confirm fungal origin in the first place (patient declined), will opt to treat as seborrheic dermatitis with low dose steroid shampoo 2-3 times weekly. Rx Fluocinolone Acetonide 0.01 % shampoo.   DM (diabetes mellitus), type 2 (HCC) A1c only slightly increased from last. Will send script for Trulicity, hopeful that patient can obtain these now.  85-month scripts sent for Trulicity, empagliflozin with 5 mg daily, and metformin 1000 mg twice daily.  Hyperlipidemia 41-month prescription sent for pravastatin 80 mg daily.  Hypertension Currently  at goal.  Patient on candesartan-HCTZ 16-12.5 mg tablets she gets from a doctor in [place].  No refills from me at this time.  Intertrigo Refill of nystatin powder sent to pharmacy.  Patient has upcoming international travel to [place] for 3 months or so.   Fayette Pho, MD [facility]

## 2022-11-08 ENCOUNTER — Other Ambulatory Visit: Payer: Self-pay

## 2022-11-09 ENCOUNTER — Encounter: Payer: Self-pay | Admitting: Family Medicine

## 2022-11-09 NOTE — Assessment & Plan Note (Signed)
37-month prescription sent for pravastatin 80 mg daily.

## 2022-11-09 NOTE — Assessment & Plan Note (Signed)
Refill of nystatin powder sent to pharmacy.  Patient has upcoming international travel to [place] for 3 months or so.

## 2022-11-09 NOTE — Assessment & Plan Note (Signed)
A1c only slightly increased from last. Will send script for Trulicity, hopeful that patient can obtain these now.  50-month scripts sent for Trulicity, empagliflozin with 5 mg daily, and metformin 1000 mg twice daily.

## 2022-11-09 NOTE — Assessment & Plan Note (Signed)
Given ketoconazole has become ineffective and we never did a skin scraping to confirm fungal origin in the first place (patient declined), will opt to treat as seborrheic dermatitis with low dose steroid shampoo 2-3 times weekly. Rx Fluocinolone Acetonide 0.01 % shampoo.

## 2022-11-09 NOTE — Assessment & Plan Note (Signed)
Currently at goal.  Patient on candesartan-HCTZ 16-12.5 mg tablets she gets from a doctor in [place].  No refills from me at this time.

## 2022-11-11 ENCOUNTER — Other Ambulatory Visit: Payer: Self-pay

## 2022-11-11 ENCOUNTER — Telehealth: Payer: Self-pay | Admitting: Family Medicine

## 2022-11-11 DIAGNOSIS — E119 Type 2 diabetes mellitus without complications: Secondary | ICD-10-CM

## 2022-11-11 DIAGNOSIS — E1165 Type 2 diabetes mellitus with hyperglycemia: Secondary | ICD-10-CM

## 2022-11-11 MED ORDER — TRULICITY 1.5 MG/0.5ML ~~LOC~~ SOAJ
1.5000 mg | SUBCUTANEOUS | 3 refills | Status: DC
  Filled 2022-11-11: qty 2, 28d supply, fill #0
  Filled 2022-11-29: qty 6, 84d supply, fill #0
  Filled 2023-02-27: qty 6, 84d supply, fill #1
  Filled 2023-05-30: qty 2, 28d supply, fill #2
  Filled 2023-07-14: qty 2, 28d supply, fill #3
  Filled 2023-08-19: qty 2, 28d supply, fill #4
  Filled 2023-09-05 – 2023-09-15 (×2): qty 2, 28d supply, fill #5
  Filled 2023-10-31: qty 2, 28d supply, fill #6

## 2022-11-11 MED ORDER — EMPAGLIFLOZIN 25 MG PO TABS
25.0000 mg | ORAL_TABLET | Freq: Every day | ORAL | 3 refills | Status: DC
  Filled 2022-11-11 – 2022-11-29 (×2): qty 90, 90d supply, fill #0
  Filled 2023-02-27: qty 90, 90d supply, fill #1
  Filled 2023-05-30: qty 90, 90d supply, fill #2
  Filled 2023-09-05: qty 90, 90d supply, fill #3

## 2022-11-11 NOTE — Telephone Encounter (Signed)
Called patient's daughter. She reports that patient will be leaving on 7/5.  Advised that she could pick up Trulicity after 6/26.  Sent secure message to pharmacist with update.   Veronda Prude, RN

## 2022-11-11 NOTE — Telephone Encounter (Signed)
Patient went to the pharmacy to pick up Jardiance and [place] for three months but pharmacy only had prescription for one month. Please call daughter [phone]

## 2022-11-11 NOTE — Telephone Encounter (Signed)
Medication re-sent to [facility] pharmacy.   Spoke with pharmacist on call regarding this, who reports they actually picked up the 90 day supply of Jardiance. Pharmacist wonders if they were not dispensed more than one month of the Trulicity because of rationing (without realizing she needed it for an overseas trip). Per pharmacist, earliest refill of Trulicity would be 6/26 and patient could get 3 month supply at that time.   He suggests that if the patient is leaving for her trip prior to that, they can run an override code and get it taken care of.   Pharmacist name - Conni Slipper   I will send him Epic secure chat message with the details.    Fayette Pho, MD

## 2022-11-14 ENCOUNTER — Other Ambulatory Visit: Payer: Self-pay

## 2022-11-29 ENCOUNTER — Other Ambulatory Visit: Payer: Self-pay

## 2023-02-27 ENCOUNTER — Other Ambulatory Visit: Payer: Self-pay

## 2023-05-16 ENCOUNTER — Ambulatory Visit (INDEPENDENT_AMBULATORY_CARE_PROVIDER_SITE_OTHER): Payer: Medicare Other | Admitting: Student

## 2023-05-16 VITALS — BP 129/80 | HR 78 | Ht 63.0 in | Wt 196.0 lb

## 2023-05-16 DIAGNOSIS — M7502 Adhesive capsulitis of left shoulder: Secondary | ICD-10-CM | POA: Diagnosis present

## 2023-05-16 DIAGNOSIS — R399 Unspecified symptoms and signs involving the genitourinary system: Secondary | ICD-10-CM

## 2023-05-16 DIAGNOSIS — E119 Type 2 diabetes mellitus without complications: Secondary | ICD-10-CM | POA: Diagnosis not present

## 2023-05-16 DIAGNOSIS — N399 Disorder of urinary system, unspecified: Secondary | ICD-10-CM

## 2023-05-16 LAB — POCT URINALYSIS DIP (MANUAL ENTRY)
Bilirubin, UA: NEGATIVE
Glucose, UA: >=1000 mg/dL — AB
Ketones, POC UA: NEGATIVE mg/dL
Leukocytes, UA: NEGATIVE
Nitrite, UA: NEGATIVE
Protein Ur, POC: 100 mg/dL — AB
Spec Grav, UA: 1.02 (ref 1.010–1.025)
Urobilinogen, UA: 0.2 U/dL
pH, UA: 5.5 (ref 5.0–8.0)

## 2023-05-16 LAB — POCT GLYCOSYLATED HEMOGLOBIN (HGB A1C): HbA1c, POC (controlled diabetic range): 6.7 % (ref 0.0–7.0)

## 2023-05-16 LAB — POCT UA - MICROSCOPIC ONLY: WBC, Ur, HPF, POC: NONE SEEN (ref 0–5)

## 2023-05-16 NOTE — Patient Instructions (Addendum)
Jacqueline Bush,  It is great to meet you today!  I think you have a bit of what we call "frozen shoulder" after your fall. I recommend that we get you into phyiscal therapy and think about a shoulder injection, but I also understand your desire to wait this out and see if it gets better with time---it likely will!  I also think your side/hip pain is similarly related to musculoskeletal pain that you can treat with the naproxen that you have at home (500mg  BID). I do not think you have a kidney  Please make an appointment to come back and see your PCP for a physical!

## 2023-05-16 NOTE — Progress Notes (Unsigned)
    SUBJECTIVE:   CHIEF COMPLAINT / HPI:   Left Shoulder Pain Patient had a fall onto her Left Shoulder about a month ago. Comes in today given ongoing pain in the joint. She is still able to use the arm, but does find that she is not able to lift it up as high as she can on the other side. Not taking anything for this. Is here today at the urging of her daughter.   Concern for Kidney Stone She saw  a white clump in her urine several days ago and is now worried about potential kidney stones. Denies any real back pain, but does have bilateral ischial spine pain. No other urinary symptoms: no dysuria, frequency, suprapubic pain, hematuria or flank pain.   DM2 Wants A1c tested today. Reports good adherence to  Trulicity, Jardiance, and metformin. Is on a statin.   PERTINENT  PMH / PSH: DM2, HTN, HLD  OBJECTIVE:   BP 129/80   Pulse 78   Ht 5\' 3"  (1.6 m)   Wt 196 lb (88.9 kg)   SpO2 97%   BMI 34.72 kg/m   Gen: Age appropriate and NAD  Abd: Obese, non-tender, non-distended, no CVA tenderness  MSK: Left shoulder without tenderness or deformity. Limited ROM both actively and passively. Abduction limited to ~90 degrees. External rotation limited to ~45 degrees. Negative empty can test. Normal Lift off testing. Normal strength in all planes of motion.  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 Thankfully doesn't seem to be impacting her day-to-day activity too much. Reviewed natural course of this condition and treatment options including physical therapy and intraarticular injection. She elects to defer either of these options in favor of watchful waiting. She plans to return in 1 month if no better. Also offered X-ray, which she likewise defers.  Urinary problem in  female Hard to say what the debris she saw in her urine was. UA significant only for glucose >1000 and protein 100, consistent with her known DM2 on Jardiance. There was trace blood on dipstick but microscopy unremarkable. Doubt this was a kidney stone given no real back/flank pain or hematuria. No need for further workup or intervention unless new symptoms arise.  Type 2 diabetes mellitus without complication, without long-term current use of insulin (HCC) A1c 6.7%. Excellent control. - Continue metformin, jardiance, and trulicity and current doses - UACR collected today with other urine sample  - Continue statin therapy (pravastatin 80mg  daily)    J Dorothyann Gibbs, MD [facility]

## 2023-05-18 LAB — MICROALBUMIN / CREATININE URINE RATIO
Creatinine, Urine: 61.7 mg/dL
Microalb/Creat Ratio: 571 mg/g{creat} — ABNORMAL HIGH (ref 0–29)
Microalbumin, Urine: 352.2 ug/mL

## 2023-05-18 NOTE — Assessment & Plan Note (Signed)
A1c 6.7%. Excellent control. - Continue metformin, jardiance, and trulicity and current doses - UACR collected today with other urine sample  - Continue statin therapy (pravastatin 80mg  daily)

## 2023-05-30 ENCOUNTER — Other Ambulatory Visit: Payer: Self-pay

## 2023-07-14 ENCOUNTER — Other Ambulatory Visit: Payer: Self-pay

## 2023-08-19 ENCOUNTER — Other Ambulatory Visit: Payer: Self-pay

## 2023-09-05 ENCOUNTER — Other Ambulatory Visit: Payer: Self-pay

## 2023-09-09 ENCOUNTER — Other Ambulatory Visit: Payer: Self-pay

## 2023-09-15 ENCOUNTER — Other Ambulatory Visit: Payer: Self-pay

## 2023-10-30 ENCOUNTER — Emergency Department (HOSPITAL_COMMUNITY)

## 2023-10-30 ENCOUNTER — Encounter (HOSPITAL_COMMUNITY): Payer: Self-pay

## 2023-10-30 ENCOUNTER — Emergency Department (HOSPITAL_COMMUNITY)
Admission: EM | Admit: 2023-10-30 | Discharge: 2023-10-30 | Disposition: A | Discharge status: Home / Self Care | Attending: Emergency Medicine | Admitting: Emergency Medicine

## 2023-10-30 ENCOUNTER — Other Ambulatory Visit: Payer: Self-pay

## 2023-10-30 DIAGNOSIS — I10 Essential (primary) hypertension: Secondary | ICD-10-CM | POA: Insufficient documentation

## 2023-10-30 DIAGNOSIS — Z79899 Other long term (current) drug therapy: Secondary | ICD-10-CM | POA: Diagnosis not present

## 2023-10-30 DIAGNOSIS — Z794 Long term (current) use of insulin: Secondary | ICD-10-CM | POA: Diagnosis not present

## 2023-10-30 DIAGNOSIS — S8011XA Contusion of right lower leg, initial encounter: Secondary | ICD-10-CM | POA: Diagnosis not present

## 2023-10-30 DIAGNOSIS — Y92093 Driveway of other non-institutional residence as the place of occurrence of the external cause: Secondary | ICD-10-CM | POA: Insufficient documentation

## 2023-10-30 DIAGNOSIS — S0083XA Contusion of other part of head, initial encounter: Secondary | ICD-10-CM | POA: Insufficient documentation

## 2023-10-30 DIAGNOSIS — Z7984 Long term (current) use of oral hypoglycemic drugs: Secondary | ICD-10-CM | POA: Insufficient documentation

## 2023-10-30 DIAGNOSIS — E119 Type 2 diabetes mellitus without complications: Secondary | ICD-10-CM | POA: Insufficient documentation

## 2023-10-30 DIAGNOSIS — S0990XA Unspecified injury of head, initial encounter: Secondary | ICD-10-CM | POA: Diagnosis present

## 2023-10-30 DIAGNOSIS — W01198A Fall on same level from slipping, tripping and stumbling with subsequent striking against other object, initial encounter: Secondary | ICD-10-CM | POA: Diagnosis not present

## 2023-10-30 NOTE — Discharge Instructions (Signed)
 Recommend ice Tylenol and ibuprofen for pain.  Bruising and swelling will be there for several days but should get better on its own.  Follow-up with your primary care doctor for further care.

## 2023-10-30 NOTE — ED Notes (Signed)
 Son at bedside.

## 2023-10-30 NOTE — ED Triage Notes (Signed)
 Pt bib ems from home; taking garbage out , tripped and fell; landed face first on concrete driveway; no loc, no dizziness, no n/v; a and o ; hematoma to forehead; bruising below r knee; no deformity, bares weight; takes asa, no thinners; no neck or back pain, moves all extremities; c collar in place; answers questions approprietly; ; hx dm, htn; unsure if took bp meds today; 160/102, HR 104, RR 16, 98% RA, cbg 157

## 2023-10-30 NOTE — ED Provider Notes (Signed)
 [place] EMERGENCY DEPARTMENT AT [facility] Provider Note   CSN: [id number] Arrival date & time: 10/30/23  1807     History  Chief Complaint  Patient presents with   Head Injury    Jacqueline Bush is a 68 y.o. female.  Patient with mechanical fall prior to evaluation.  Patient speaks Arabic.  I offered them translator but family refused and would like to just translate for her.  Sounds like she tripped and fell.  She has bruising to her right shin.  She has been ambulatory since the fall.  Is not on any blood thinners.  She did hit her forehead and has bruising in that area as well.  She has no other pain elsewhere.  No hip pain no back pain no abdominal pain no upper extremity pain.  The history is provided by the patient and a relative.       Home Medications Prior to Admission medications   Medication Sig Start Date End Date Taking? Authorizing Provider  baclofen  (LIORESAL ) 20 MG tablet Take 1 tablet (20 mg total) by mouth 3 (three) times daily. 11/07/22   Santana Cue, MD  benzonatate  (TESSALON ) 100 MG capsule Take 1 capsule (100 mg total) by mouth 2 (two) times daily as needed for cough. 04/12/22   Santana Cue, MD  blood glucose meter kit and supplies Dispense based on patient and insurance preference. Use up to four times daily as directed. (FOR ICD-10 E10.9, E11.9).  ANY GENERIC BRAND ACCEPTABLE 09/30/17   Russell Court, DO  Blood Pressure Monitoring (WRIST BLOOD PRESSURE MONITOR) MISC 1 Device by Does not apply route once. 10/10/11   Charmayne Cooper, MD  candesartan -hydrochlorothiazide  (ATACAND  HCT) 16-12.5 MG tablet Take 1 tablet by mouth daily. 11/02/20   Santana Cue, MD  cetirizine  (ZYRTEC  ALLERGY) 10 MG tablet Take 1 tablet (10 mg total) by mouth daily. 04/12/22   Santana Cue, MD  Dulaglutide  (TRULICITY ) 1.5 MG/0.5ML SOAJ Inject 1.5 mg into the skin once a week. 11/11/22   Santana Cue, MD  empagliflozin  (JARDIANCE ) 25 MG TABS tablet Take 1  tablet (25 mg total) by mouth daily. 11/11/22   Santana Cue, MD  Fluocinolone  Acetonide 0.01 % SHAM Use the shampoo 2-3 times weekly. Leave on scalp for 3-5 minutes before rinsing. 11/07/22   Santana Cue, MD  Insulin Pen Needle (PEN NEEDLES) 30G X 8 MM MISC For use in once weekly Ozempic  injection 07/19/22   Santana Cue, MD  ketoconazole  (NIZORAL ) 2 % shampoo Apply 1 Application topically 2 (two) times a week. Let it set on your head for at least 2-3 minutes before rinsing off. 08/08/22   Santana Cue, MD  metFORMIN  (GLUCOPHAGE ) 1000 MG tablet Take 1 tablet (1,000 mg total) by mouth 2 (two) times daily with a meal. 11/07/22   Santana Cue, MD  naproxen  (NAPROSYN ) 500 MG tablet Take 1 tablet (500 mg total) by mouth 2 (two) times daily with a meal. 08/01/22   Joelle Musca, MD  nystatin  (MYCOSTATIN /NYSTOP ) powder Apply 1 Application topically 3 (three) times daily. 11/07/22   Santana Cue, MD  pravastatin  (PRAVACHOL ) 80 MG tablet Take 1 tablet (80 mg total) by mouth daily. 11/07/22   Santana Cue, MD      Allergies    Penicillins    Review of Systems   Review of Systems  Physical Exam Updated Vital Signs BP (!) 148/75 (BP Location: Right Arm)   Pulse 88  Temp 98.1 F (36.7 C) (Oral)   Resp 18   Ht 5\' 3"  (1.6 m)   Wt 88.5 kg   SpO2 99%   BMI 34.54 kg/m  Physical Exam Vitals and nursing note reviewed.  Constitutional:      General: She is not in acute distress.    Appearance: She is well-developed. She is not ill-appearing.  HENT:     Head:     Comments: Hematoma to forehead    Nose: Nose normal.     Mouth/Throat:     Mouth: Mucous membranes are moist.  Eyes:     Extraocular Movements: Extraocular movements intact.     Conjunctiva/sclera: Conjunctivae normal.     Pupils: Pupils are equal, round, and reactive to light.  Cardiovascular:     Rate and Rhythm: Normal rate and regular rhythm.     Pulses: Normal pulses.     Heart sounds: Normal heart  sounds. No murmur heard. Pulmonary:     Effort: Pulmonary effort is normal. No respiratory distress.     Breath sounds: Normal breath sounds.  Abdominal:     General: Abdomen is flat.     Palpations: Abdomen is soft.     Tenderness: There is no abdominal tenderness.  Musculoskeletal:        General: No swelling.     Cervical back: Normal range of motion and neck supple.  Skin:    General: Skin is warm and dry.     Capillary Refill: Capillary refill takes less than 2 seconds.     Comments: Hematoma to right shin  Neurological:     General: No focal deficit present.     Mental Status: She is alert.  Psychiatric:        Mood and Affect: Mood normal.     ED Results / Procedures / Treatments   Labs (all labs ordered are listed, but only abnormal results are displayed) Labs Reviewed - No data to display  EKG None  Radiology DG Tibia/Fibula Right Result Date: 10/30/2023 CLINICAL DATA:  Tripped and fell on concrete. Bruising below right knee. EXAM: RIGHT TIBIA AND FIBULA - 2 VIEW; RIGHT KNEE - COMPLETE 4+ VIEW COMPARISON:  None Available. FINDINGS: No acute fracture or dislocation in the right knee, tibia, or fibula. Mild narrowing of the medial compartment in the knee. No knee joint effusion. Focal swelling anterior to the proximal tibia compatible with hematoma/contusion. IMPRESSION: No acute fracture in the right knee, tibia, or fibula. Electronically Signed   By: Rozell Cornet M.D.   On: 10/30/2023 20:23   DG Knee Complete 4 Views Right Result Date: 10/30/2023 CLINICAL DATA:  Tripped and fell on concrete. Bruising below right knee. EXAM: RIGHT TIBIA AND FIBULA - 2 VIEW; RIGHT KNEE - COMPLETE 4+ VIEW COMPARISON:  None Available. FINDINGS: No acute fracture or dislocation in the right knee, tibia, or fibula. Mild narrowing of the medial compartment in the knee. No knee joint effusion. Focal swelling anterior to the proximal tibia compatible with hematoma/contusion. IMPRESSION: No  acute fracture in the right knee, tibia, or fibula. Electronically Signed   By: Rozell Cornet M.D.   On: 10/30/2023 20:23   CT Head Wo Contrast Result Date: 10/30/2023 CLINICAL DATA:  Fall, head injury EXAM: CT HEAD WITHOUT CONTRAST CT MAXILLOFACIAL WITHOUT CONTRAST CT CERVICAL SPINE WITHOUT CONTRAST TECHNIQUE: Multidetector CT imaging of the head, cervical spine, and maxillofacial structures were performed using the standard protocol without intravenous contrast. Multiplanar CT image reconstructions of the cervical spine  and maxillofacial structures were also generated. RADIATION DOSE REDUCTION: This exam was performed according to the departmental dose-optimization program which includes automated exposure control, adjustment of the mA and/or kV according to patient size and/or use of iterative reconstruction technique. COMPARISON:  None Available. FINDINGS: CT HEAD FINDINGS Brain: No evidence of acute infarction, hemorrhage, hydrocephalus, extra-axial collection or mass lesion/mass effect. Vascular: No hyperdense vessel or unexpected calcification. CT FACIAL BONES FINDINGS Skull: Normal. Negative for fracture or focal lesion. Facial bones: No displaced fractures or dislocations. Sinuses/Orbits: No acute finding. Other: Soft tissue contusion of the forehead and nose (series 3, image 14). CT CERVICAL SPINE FINDINGS Alignment: Normal. Skull base and vertebrae: No acute fracture. No primary bone lesion or focal pathologic process. Soft tissues and spinal canal: No prevertebral fluid or swelling. No visible canal hematoma. Disc levels:  Mild multilevel cervical disc degenerative disease. Upper chest: Negative. Other: None. IMPRESSION: 1. No acute intracranial pathology. 2. No displaced fractures or dislocations of the facial bones. 3. Soft tissue contusion of the forehead and nose. 4. No fracture or static subluxation of the cervical spine. 5. Mild multilevel cervical disc degenerative disease. Electronically  Signed   By: Fredricka Jenny M.D.   On: 10/30/2023 20:08   CT Cervical Spine Wo Contrast Result Date: 10/30/2023 CLINICAL DATA:  Fall, head injury EXAM: CT HEAD WITHOUT CONTRAST CT MAXILLOFACIAL WITHOUT CONTRAST CT CERVICAL SPINE WITHOUT CONTRAST TECHNIQUE: Multidetector CT imaging of the head, cervical spine, and maxillofacial structures were performed using the standard protocol without intravenous contrast. Multiplanar CT image reconstructions of the cervical spine and maxillofacial structures were also generated. RADIATION DOSE REDUCTION: This exam was performed according to the departmental dose-optimization program which includes automated exposure control, adjustment of the mA and/or kV according to patient size and/or use of iterative reconstruction technique. COMPARISON:  None Available. FINDINGS: CT HEAD FINDINGS Brain: No evidence of acute infarction, hemorrhage, hydrocephalus, extra-axial collection or mass lesion/mass effect. Vascular: No hyperdense vessel or unexpected calcification. CT FACIAL BONES FINDINGS Skull: Normal. Negative for fracture or focal lesion. Facial bones: No displaced fractures or dislocations. Sinuses/Orbits: No acute finding. Other: Soft tissue contusion of the forehead and nose (series 3, image 14). CT CERVICAL SPINE FINDINGS Alignment: Normal. Skull base and vertebrae: No acute fracture. No primary bone lesion or focal pathologic process. Soft tissues and spinal canal: No prevertebral fluid or swelling. No visible canal hematoma. Disc levels:  Mild multilevel cervical disc degenerative disease. Upper chest: Negative. Other: None. IMPRESSION: 1. No acute intracranial pathology. 2. No displaced fractures or dislocations of the facial bones. 3. Soft tissue contusion of the forehead and nose. 4. No fracture or static subluxation of the cervical spine. 5. Mild multilevel cervical disc degenerative disease. Electronically Signed   By: Fredricka Jenny M.D.   On: 10/30/2023 20:08   CT  Maxillofacial Wo Contrast Result Date: 10/30/2023 CLINICAL DATA:  Fall, head injury EXAM: CT HEAD WITHOUT CONTRAST CT MAXILLOFACIAL WITHOUT CONTRAST CT CERVICAL SPINE WITHOUT CONTRAST TECHNIQUE: Multidetector CT imaging of the head, cervical spine, and maxillofacial structures were performed using the standard protocol without intravenous contrast. Multiplanar CT image reconstructions of the cervical spine and maxillofacial structures were also generated. RADIATION DOSE REDUCTION: This exam was performed according to the departmental dose-optimization program which includes automated exposure control, adjustment of the mA and/or kV according to patient size and/or use of iterative reconstruction technique. COMPARISON:  None Available. FINDINGS: CT HEAD FINDINGS Brain: No evidence of acute infarction, hemorrhage, hydrocephalus, extra-axial collection or mass lesion/mass  effect. Vascular: No hyperdense vessel or unexpected calcification. CT FACIAL BONES FINDINGS Skull: Normal. Negative for fracture or focal lesion. Facial bones: No displaced fractures or dislocations. Sinuses/Orbits: No acute finding. Other: Soft tissue contusion of the forehead and nose (series 3, image 14). CT CERVICAL SPINE FINDINGS Alignment: Normal. Skull base and vertebrae: No acute fracture. No primary bone lesion or focal pathologic process. Soft tissues and spinal canal: No prevertebral fluid or swelling. No visible canal hematoma. Disc levels:  Mild multilevel cervical disc degenerative disease. Upper chest: Negative. Other: None. IMPRESSION: 1. No acute intracranial pathology. 2. No displaced fractures or dislocations of the facial bones. 3. Soft tissue contusion of the forehead and nose. 4. No fracture or static subluxation of the cervical spine. 5. Mild multilevel cervical disc degenerative disease. Electronically Signed   By: Fredricka Jenny M.D.   On: 10/30/2023 20:08    Procedures Procedures    Medications Ordered in  ED Medications - No data to display  ED Course/ Medical Decision Making/ A&P                                 Medical Decision Making Amount and/or Complexity of Data Reviewed Radiology: ordered.   Saki Stailey is here with fall.  Hematoma to the forehead, hematoma to the right shin.  Not on any blood thinners.  History of hypertension and diabetes.  Does not have pain elsewhere.  Not lose consciousness.  Family member translating.  They refused translator.  She is got bruising to the forehead bruising to the right shin but no obvious deformities.  CT scan of the head neck face and x-rays of the right knee and right shin were obtained that were unremarkable per radiology report.  Overall suspect contusions.  She is ambulatory.  She has no pain elsewhere.  Recommend Tylenol ice ibuprofen and rest.  Patient did not want any pain medicine or ice here.  Patient discharged.  This chart was dictated using voice recognition software.  Despite best efforts to proofread,  errors can occur which can change the documentation meaning.         Final Clinical Impression(s) / ED Diagnoses Final diagnoses:  Contusion of face, initial encounter  Contusion of right lower extremity, initial encounter    Rx / DC Orders ED Discharge Orders     None         Lowery Rue, DO 10/30/23 2148

## 2023-10-30 NOTE — ED Notes (Signed)
 Pt care taken, waiting to hear from his CT scans.

## 2023-10-31 ENCOUNTER — Other Ambulatory Visit: Payer: Self-pay

## 2023-11-28 ENCOUNTER — Other Ambulatory Visit: Payer: Self-pay | Admitting: Student

## 2023-11-28 ENCOUNTER — Other Ambulatory Visit: Payer: Self-pay

## 2023-11-28 DIAGNOSIS — E1165 Type 2 diabetes mellitus with hyperglycemia: Secondary | ICD-10-CM

## 2023-11-28 DIAGNOSIS — E119 Type 2 diabetes mellitus without complications: Secondary | ICD-10-CM

## 2023-11-28 MED ORDER — EMPAGLIFLOZIN 25 MG PO TABS
25.0000 mg | ORAL_TABLET | Freq: Every day | ORAL | 3 refills | Status: AC
  Filled 2023-11-28 – 2023-12-29 (×2): qty 90, 90d supply, fill #0
  Filled 2024-04-02: qty 90, 90d supply, fill #1

## 2023-11-28 MED ORDER — METFORMIN HCL 1000 MG PO TABS
1000.0000 mg | ORAL_TABLET | Freq: Two times a day (BID) | ORAL | 3 refills | Status: AC
  Filled 2023-11-28 – 2023-12-29 (×2): qty 180, 90d supply, fill #0
  Filled 2024-04-02: qty 180, 90d supply, fill #1

## 2023-12-10 ENCOUNTER — Other Ambulatory Visit: Payer: Self-pay

## 2023-12-29 ENCOUNTER — Other Ambulatory Visit: Payer: Self-pay | Admitting: Family Medicine

## 2023-12-29 ENCOUNTER — Other Ambulatory Visit: Payer: Self-pay

## 2023-12-29 DIAGNOSIS — E119 Type 2 diabetes mellitus without complications: Secondary | ICD-10-CM

## 2023-12-29 DIAGNOSIS — E785 Hyperlipidemia, unspecified: Secondary | ICD-10-CM

## 2023-12-30 ENCOUNTER — Other Ambulatory Visit: Payer: Self-pay

## 2023-12-30 ENCOUNTER — Other Ambulatory Visit (HOSPITAL_COMMUNITY): Payer: Self-pay

## 2023-12-30 MED ORDER — PRAVASTATIN SODIUM 80 MG PO TABS
80.0000 mg | ORAL_TABLET | Freq: Every day | ORAL | 3 refills | Status: AC
  Filled 2023-12-30 (×2): qty 90, 90d supply, fill #0

## 2023-12-30 MED ORDER — TRULICITY 1.5 MG/0.5ML ~~LOC~~ SOAJ
1.5000 mg | SUBCUTANEOUS | 3 refills | Status: AC
  Filled 2023-12-30 (×2): qty 6, 84d supply, fill #0
  Filled 2024-04-02: qty 6, 84d supply, fill #1

## 2024-04-02 ENCOUNTER — Other Ambulatory Visit: Payer: Self-pay

## 2024-04-15 NOTE — Assessment & Plan Note (Signed)
-   Last A1c 6.7 in 05/2023, 6.4 today - Medications: Trulicity  1.5 mg weekly, Metformin  1000 mg BID, Jardiance  25 mg daily - Adherence: Reports good compliance, advised patient to bring in her medications at her next visit - Eye exam: Advised f/u with eye doc, referral placed to ophthalmology - Foot exam: Performed today - BMP/Microalbumin: Collected today - Statin: Pravastatin  80 mg daily - No symptoms of hypoglycemia, polyuria, polydipsia, numbness extremities, foot ulcers/trauma

## 2024-04-15 NOTE — Progress Notes (Cosign Needed Addendum)
    SUBJECTIVE:  Interpreter: Claudine #[id number]  CHIEF COMPLAINT / HPI:   DM - Reports taking her medications as prescribed  Head and Leg Pain s/p fall in May - Fell and was diagnosed with a concussion in 10/2023 - CT head was negative for acute pathology at the time - Currently still endorses head and leg swelling and pain and wanted us  to check if this is normal  PERTINENT  PMH / PSH: HTN, T2DM, HLD, Intertrigo  OBJECTIVE:   BP (!) 148/85   Pulse 86   Ht 5' 3 (1.6 m)   Wt 194 lb 9.6 oz (88.3 kg)   SpO2 96%   BMI 34.47 kg/m   General: Awake and Alert in NAD HEENT: NCAT. Sclera anicteric. No rhinorrhea. Cardiovascular: RRR. No M/R/G Respiratory: CTAB, normal WOB on RA. No wheezing, crackles, rhonchi, or diminished breath sounds. Abdomen: Soft, non-tender, non-distended. Bowel sounds normoactive Extremities: Able to move all extremities. No BLE edema or significant joint findings.  Skin: Warm and dry. Varicose veins noted. Neuro: A&Ox3. No focal neurological deficits.  Diabetic Foot Exam - Simple   Simple Foot Form Diabetic Foot exam was performed with the following findings: Yes 04/16/2024  4:39 PM  Visual Inspection No deformities, no ulcerations, no other skin breakdown bilaterally: Yes Sensation Testing Intact to touch and monofilament testing bilaterally: Yes Pulse Check Posterior Tibialis and Dorsalis pulse intact bilaterally: Yes Comments    ASSESSMENT/PLAN:   Assessment & Plan Type 2 diabetes mellitus without complication, without long-term current use of insulin (HCC) - Last A1c 6.7 in 05/2023, 6.4 today - Medications: Trulicity  1.5 mg weekly, Metformin  1000 mg BID, Jardiance  25 mg daily - Adherence: Reports good compliance, advised patient to bring in her medications at her next visit - Eye exam: Advised f/u with eye doc, referral placed to ophthalmology - Foot exam: Performed today - BMP/Microalbumin: Collected today - Statin: Pravastatin  80 mg  daily - No symptoms of hypoglycemia, polyuria, polydipsia, numbness extremities, foot ulcers/trauma Screening mammogram for breast cancer Mammogram ordered.  Provided phone number for GI breast center to call and make an appointment. Primary hypertension Microalbuminuria due to type 2 diabetes mellitus (HCC) Patient's blood pressure is not controlled today. BP: (!) 148/85. Goal of 130/80. Patient's medication regimen includes Candesartan -HCTZ 15-12.5 mg daily. 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 - No changes to current regimen. Advised patient to bring her medications to her next visit since it is difficult to determine if she is taking her medications as prescribed w/ language barrier - Labs: BMP, urine microalbumin/creatinine  - May need to consider further workup / possible referral to nephrology  Advised patient to take Tylenol as needed for headache or leg pain.  Reemphasized that her imaging study was negative for any acute pathology.  PCP Recs - Med rec - F/u HTN, titrate medications as able - F/u Microalbuminuria and consider further workup / referral to nephrology  Kathrine Melena, DO [facility]

## 2024-04-16 ENCOUNTER — Ambulatory Visit (INDEPENDENT_AMBULATORY_CARE_PROVIDER_SITE_OTHER): Admitting: Family Medicine

## 2024-04-16 VITALS — BP 148/85 | HR 86 | Ht 63.0 in | Wt 194.6 lb

## 2024-04-16 DIAGNOSIS — R809 Proteinuria, unspecified: Secondary | ICD-10-CM

## 2024-04-16 DIAGNOSIS — E119 Type 2 diabetes mellitus without complications: Secondary | ICD-10-CM | POA: Diagnosis not present

## 2024-04-16 DIAGNOSIS — Z0001 Encounter for general adult medical examination with abnormal findings: Secondary | ICD-10-CM | POA: Diagnosis not present

## 2024-04-16 DIAGNOSIS — I10 Essential (primary) hypertension: Secondary | ICD-10-CM

## 2024-04-16 DIAGNOSIS — E1129 Type 2 diabetes mellitus with other diabetic kidney complication: Secondary | ICD-10-CM | POA: Diagnosis not present

## 2024-04-16 DIAGNOSIS — Z1231 Encounter for screening mammogram for malignant neoplasm of breast: Secondary | ICD-10-CM | POA: Diagnosis not present

## 2024-04-16 LAB — POCT GLYCOSYLATED HEMOGLOBIN (HGB A1C): Hemoglobin A1C: 6.4 % — AB (ref 4.0–5.6)

## 2024-04-16 NOTE — Patient Instructions (Addendum)
????? ?????? ?????! ????? ???????? ????? ??? ???? ??????? ??????? ?????? ???????. ???? ?????? ????? ???? ??? ?????? ????? ?? ????? ??????.  ??????? ????? ?? ???:   1. ??? ?????? - ????? ???????????? ?????? (A1c) ????? ????? ??? 6.4 ?????? ???? ??? ???! ????? ?? ????? ?????? ??? ?? ?????. ????? ???? ?????? ??????? ???????? ?????. 2. ??? ????? ????? ??????? ??????? ????? ?? ????? ?????. ???? ??????? ??? ????? [id number] ???? ???? ?? 1002 ???? ??????? ???????? ??? 401? ?????????? ????????? ???????? [id number]. 3. ?? ????? ??? ????? ??????? ?????? ??????? ?????? ?????? ???? ?????. ??? ???? ??? ?????? ???? ??? ???? ?????? ??? ????? ??? ???? ?? ?????.  ??? ?? ??? ?? ??? ???? ??????? ??? ???????? ?? My Chart ?????? ?????? ??? ????? ??????? ???????? ???????? ?? ?????.  ????? ??? ???????? ???????? ?????. ??? ???? ??????? ??? ??????? ?????? ??. ??? ???? ??????? ?????? ?? ????? ??? MyChart (??? ???? ????) ?? ????? ???????. ??? ?? ???? ????? ??????? ???? ????????? ????????? ????? ??????? ????????.  ??? ???? ?????? ??? ??????? ??? ???? ??????? ?? ?? ?????.  ???? ?????? ??? ????? ?? 15 ????? ????? ????? ????? ????? ?????? ??????. ???? ????? ?? ??? ?????.  ????? ?? ??? ????? ?????? ?? ???????? ?? ???????  ??????? ?????? ??????? ?? ?? ?????? 14/04/2024? ?????? 3:39 ????? PGY-2? ?? ?????? ?? [place]  It was great to see you today! Thank you for choosing [facility] for your primary care. Jacqueline Bush was seen for diabetes and head/leg pain.  Today we addressed: Diabetes - A1c is stable and improved to 6.4 today which is good! Keep taking your medications as prescribed. We will check your urine and labs today.  Mammogram ordered to screen for breast cancer Please call [phone] to make an appointment at [address], [place], [place] [address] It is important to screen for colon cancer with a colonoscopy and for osteoporosis with a dexa scan. You declined this today, but should discuss this  again with your PCP.  If you haven't already, sign up for My Chart to have easy access to your labs results, and communication with your primary care physician.  We are checking some labs today. If they are abnormal, I will call you. If they are normal, I will send you a MyChart message (if it is active) or a letter in the mail. If you do not hear about your labs in the next 2 weeks, please call the office.  You should return to our clinic Return if symptoms worsen or fail to improve. Please arrive 15 minutes before your appointment to ensure smooth check in process.  We appreciate your efforts in making this happen.  Thank you for allowing me to participate in your care, Kathrine Melena, DO 04/16/2024, 3:39 PM PGY-2, [facility]

## 2024-04-17 LAB — LIPID PANEL
Chol/HDL Ratio: 1.8 ratio (ref 0.0–4.4)
Cholesterol, Total: 129 mg/dL (ref 100–199)
HDL: 73 mg/dL (ref >39)
LDL Chol Calc (NIH): 47 mg/dL (ref 0–99)
Triglycerides: 32 mg/dL (ref 0–149)
VLDL Cholesterol Cal: 9 mg/dL (ref 5–40)

## 2024-04-17 LAB — BASIC METABOLIC PANEL WITH GFR
BUN/Creatinine Ratio: 15 (ref 12–28)
BUN: 11 mg/dL (ref 8–27)
CO2: 22 mmol/L (ref 20–29)
Calcium: 9.3 mg/dL (ref 8.7–10.3)
Chloride: 102 mmol/L (ref 96–106)
Creatinine, Ser: 0.73 mg/dL (ref 0.57–1.00)
Glucose: 85 mg/dL (ref 70–99)
Potassium: 3.9 mmol/L (ref 3.5–5.2)
Sodium: 138 mmol/L (ref 134–144)
eGFR: 90 mL/min/1.73 (ref >59)

## 2024-04-17 LAB — MICROALBUMIN / CREATININE URINE RATIO
Creatinine, Urine: 69.9 mg/dL
Microalb/Creat Ratio: 644 mg/g{creat} — ABNORMAL HIGH (ref 0–29)
Microalbumin, Urine: 450.3 ug/mL

## 2024-04-18 NOTE — Assessment & Plan Note (Signed)
 Patient's blood pressure is not controlled today. BP: (!) 148/85. Goal of 130/80. Patient's medication regimen includes Candesartan -HCTZ 15-12.5 mg daily. 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 - No changes to current regimen. Advised patient to bring her medications to her next visit since it is difficult to determine if she is taking her medications as prescribed w/ language barrier - Labs: BMP, urine microalbumin/creatinine  - May need to consider further workup / possible referral to nephrology

## 2024-04-21 ENCOUNTER — Ambulatory Visit: Payer: Self-pay | Admitting: Family Medicine

## 2024-04-22 NOTE — Telephone Encounter (Signed)
 Called patient with Arabic interpreter, ID# [id number].   Patient did not answer, LVM requesting that patient return call back to office in order to discuss this matter further.   Chiquita JAYSON English, RN

## 2024-06-11 ENCOUNTER — Ambulatory Visit: Payer: Self-pay | Admitting: Student
